# Patient Record
Sex: Male | Born: 1975
Health system: Southern US, Community
[De-identification: ages and names within clinical notes are randomized; demographics above are authoritative.]

## PROBLEM LIST (undated history)

## (undated) DIAGNOSIS — R635 Abnormal weight gain: Secondary | ICD-10-CM

## (undated) DIAGNOSIS — T50905A Adverse effect of unspecified drugs, medicaments and biological substances, initial encounter: Secondary | ICD-10-CM

## (undated) DIAGNOSIS — F419 Anxiety disorder, unspecified: Secondary | ICD-10-CM

## (undated) DIAGNOSIS — F410 Panic disorder [episodic paroxysmal anxiety] without agoraphobia: Secondary | ICD-10-CM

## (undated) HISTORY — PX: COSMETIC SURGERY: SHX468

## (undated) HISTORY — DX: Abnormal weight gain: R63.5

## (undated) HISTORY — PX: VASECTOMY: SHX75

## (undated) HISTORY — DX: Adverse effect of unspecified drugs, medicaments and biological substances, initial encounter: T50.905A

---

## 2016-03-26 DIAGNOSIS — Z0000 Encounter for general adult medical examination without abnormal findings: Secondary | ICD-10-CM | POA: Diagnosis not present

## 2016-07-08 DIAGNOSIS — Z23 Encounter for immunization: Secondary | ICD-10-CM | POA: Diagnosis not present

## 2017-02-24 DIAGNOSIS — L02415 Cutaneous abscess of right lower limb: Secondary | ICD-10-CM | POA: Diagnosis not present

## 2017-02-24 DIAGNOSIS — L03115 Cellulitis of right lower limb: Secondary | ICD-10-CM | POA: Diagnosis not present

## 2017-04-02 DIAGNOSIS — Z0000 Encounter for general adult medical examination without abnormal findings: Secondary | ICD-10-CM | POA: Diagnosis not present

## 2017-04-06 DIAGNOSIS — G5621 Lesion of ulnar nerve, right upper limb: Secondary | ICD-10-CM | POA: Diagnosis not present

## 2017-04-06 DIAGNOSIS — M654 Radial styloid tenosynovitis [de Quervain]: Secondary | ICD-10-CM | POA: Diagnosis not present

## 2017-04-06 DIAGNOSIS — F411 Generalized anxiety disorder: Secondary | ICD-10-CM | POA: Diagnosis not present

## 2017-05-25 DIAGNOSIS — F900 Attention-deficit hyperactivity disorder, predominantly inattentive type: Secondary | ICD-10-CM | POA: Diagnosis not present

## 2017-05-25 DIAGNOSIS — F411 Generalized anxiety disorder: Secondary | ICD-10-CM | POA: Diagnosis not present

## 2017-06-29 DIAGNOSIS — Z3009 Encounter for other general counseling and advice on contraception: Secondary | ICD-10-CM | POA: Diagnosis not present

## 2017-08-07 DIAGNOSIS — Z302 Encounter for sterilization: Secondary | ICD-10-CM | POA: Diagnosis not present

## 2017-08-16 DIAGNOSIS — Z23 Encounter for immunization: Secondary | ICD-10-CM | POA: Diagnosis not present

## 2017-11-09 DIAGNOSIS — Z9852 Vasectomy status: Secondary | ICD-10-CM | POA: Diagnosis not present

## 2017-12-15 DIAGNOSIS — F411 Generalized anxiety disorder: Secondary | ICD-10-CM | POA: Diagnosis not present

## 2017-12-15 DIAGNOSIS — F410 Panic disorder [episodic paroxysmal anxiety] without agoraphobia: Secondary | ICD-10-CM | POA: Diagnosis not present

## 2017-12-30 DIAGNOSIS — J301 Allergic rhinitis due to pollen: Secondary | ICD-10-CM | POA: Diagnosis not present

## 2018-01-04 DIAGNOSIS — F411 Generalized anxiety disorder: Secondary | ICD-10-CM | POA: Diagnosis not present

## 2018-01-04 DIAGNOSIS — F410 Panic disorder [episodic paroxysmal anxiety] without agoraphobia: Secondary | ICD-10-CM | POA: Diagnosis not present

## 2018-01-14 DIAGNOSIS — J301 Allergic rhinitis due to pollen: Secondary | ICD-10-CM | POA: Diagnosis not present

## 2018-02-15 DIAGNOSIS — F411 Generalized anxiety disorder: Secondary | ICD-10-CM | POA: Diagnosis not present

## 2018-02-15 DIAGNOSIS — F410 Panic disorder [episodic paroxysmal anxiety] without agoraphobia: Secondary | ICD-10-CM | POA: Diagnosis not present

## 2018-04-06 DIAGNOSIS — Z0000 Encounter for general adult medical examination without abnormal findings: Secondary | ICD-10-CM | POA: Diagnosis not present

## 2018-05-25 DIAGNOSIS — L237 Allergic contact dermatitis due to plants, except food: Secondary | ICD-10-CM | POA: Diagnosis not present

## 2018-05-31 DIAGNOSIS — L237 Allergic contact dermatitis due to plants, except food: Secondary | ICD-10-CM | POA: Diagnosis not present

## 2018-07-19 DIAGNOSIS — Z23 Encounter for immunization: Secondary | ICD-10-CM | POA: Diagnosis not present

## 2018-09-02 DIAGNOSIS — F411 Generalized anxiety disorder: Secondary | ICD-10-CM | POA: Diagnosis not present

## 2018-09-02 DIAGNOSIS — R635 Abnormal weight gain: Secondary | ICD-10-CM | POA: Diagnosis not present

## 2018-09-02 DIAGNOSIS — F410 Panic disorder [episodic paroxysmal anxiety] without agoraphobia: Secondary | ICD-10-CM | POA: Diagnosis not present

## 2018-09-06 ENCOUNTER — Ambulatory Visit (INDEPENDENT_AMBULATORY_CARE_PROVIDER_SITE_OTHER): Payer: BLUE CROSS/BLUE SHIELD | Admitting: Physician Assistant

## 2018-09-06 ENCOUNTER — Encounter: Payer: Self-pay | Admitting: Physician Assistant

## 2018-09-06 VITALS — BP 127/80 | HR 79 | Ht 74.0 in | Wt 210.0 lb

## 2018-09-06 DIAGNOSIS — T50905A Adverse effect of unspecified drugs, medicaments and biological substances, initial encounter: Secondary | ICD-10-CM

## 2018-09-06 DIAGNOSIS — R635 Abnormal weight gain: Secondary | ICD-10-CM

## 2018-09-06 DIAGNOSIS — F429 Obsessive-compulsive disorder, unspecified: Secondary | ICD-10-CM | POA: Diagnosis not present

## 2018-09-06 DIAGNOSIS — F4010 Social phobia, unspecified: Secondary | ICD-10-CM | POA: Diagnosis not present

## 2018-09-06 HISTORY — DX: Adverse effect of unspecified drugs, medicaments and biological substances, initial encounter: R63.5

## 2018-09-06 HISTORY — DX: Adverse effect of unspecified drugs, medicaments and biological substances, initial encounter: T50.905A

## 2018-09-06 MED ORDER — FLUOXETINE HCL 40 MG PO CAPS: 40.0000 mg | ORAL_CAPSULE | Freq: Every day | ORAL | 1 refills | Status: DC

## 2018-09-06 NOTE — Progress Notes (Signed)
[facility] MD/PA/NP Initial Note  09/06/2018 3:03 PM Robert Robbins  MRN:  [id number]  Chief Complaint:  Chief Complaint    Anxiety      HPI: Here for initial visit for anxiety, panic attacks, social anxiety.  Robert Robbins is a 42 year old male who presents with social anxiety.  He states he has always had problems, especially with public speaking.  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  In the past year or so, he became more anxious and having panic attacks sometimes several days a week.  He would have palpitations and gets short of breath.  Most of the time it was a situational anxiety, in public and when he was having to speak.  He saw his PCP and was put on Lexapro up to 20 mg at the highest dose.  He states he was doing very well on that and had less anxiety and panic attacks.  However he gained 35 pounds over the course of 8 or 9 months.  He saw his PCP again about 6 weeks ago and was put on Prozac 20 mg instead.  He states the anxiety is not as well controlled, having had a panic attack as recent as yesterday.  The Xanax that he is been given for breakthrough was not as helpful yesterday as it had been.  He usually only takes Xanax maybe 1 to 2 pills a week.  He is unsure if the Prozac has caused any weight gain but his clothes are still fitting and he does not think anything has changed.  He does not feel like he is lost any though.  Patient denies loss of interest in usual activities and is able to enjoy things.  Denies decreased energy or motivation.  Appetite has not changed.  No extreme sadness, tearfulness, or feelings of hopelessness.  Denies any changes in concentration, making decisions or remembering things.  Denies suicidal or homicidal thoughts.  Patient denies increased energy with decreased need for sleep, no increased talkativeness, no racing thoughts, no impulsivity or risky behaviors, no increased spending, no increased libido, no  grandiosity.  He sometimes gets irritable in the evenings after he gets home.  He feels that the anxiety comes out as grumpiness at times.  He has had GeneSight testing done.  See report in scanned records.   Visit Diagnosis:    ICD-10-CM   1. Social anxiety disorder F40.10   2. Obsessive-compulsive disorder, unspecified type F42.9     Past Psychiatric History: None reported except possibly OCD when younger.  Looking back, he states he probably had anxiety as well but was able to overcome it and it did not affect his school or work.  Past Medical History:  Past Medical History:  Diagnosis Date  . Weight gain due to medication 09/06/2018   with Lexapro 35# over 8 months    Past Surgical History:  Procedure Laterality Date  . COSMETIC SURGERY Bilateral    auricular bilat  . VASECTOMY      Family Psychiatric History:   Family History:  Family History  Problem Relation Age of Onset  . Anxiety disorder Mother   . Hypertension Mother   . Obesity Mother   . Anxiety disorder Father   . Atrial fibrillation Father   . Hyperlipidemia Father   . Obesity Father   . Bipolar disorder Maternal Aunt   . Bipolar disorder Cousin   . Aneurysm Maternal Grandfather   .  CVA Maternal Grandfather   . Coronary artery disease Paternal Grandfather   . Anxiety disorder Daughter   . Anxiety disorder Son     Social History:  Social History   Socioeconomic History  . Marital status: Married    Spouse name: Not on file  . Number of children: 2  . Years of education: Not on file  . Highest education level: Bachelor's degree (e.g., BA, AB, BS)  Occupational History  . Occupation: Photographer   Social Needs  . Financial resource strain: Not hard at all  . Food insecurity:    Worry: Never true    Inability: Never true  . Transportation needs:    Medical: No    Non-medical: No  Tobacco Use  . Smoking status: Former Smoker    Last attempt to quit: 09/06/2005    Years since quitting: 13.0   . Smokeless tobacco: Former Neurosurgeon    Quit date: 09/06/2005  Substance and Sexual Activity  . Alcohol use: Yes    Alcohol/week: 12.0 standard drinks    Types: 12 Cans of beer per week    Comment: per week  . Drug use: Never    Comment: except pot in college  . Sexual activity: Yes  Lifestyle  . Physical activity:    Days per week: 3 days    Minutes per session: 150+ min  . Stress: To some extent  Relationships  . Social connections:    Talks on phone: More than three times a week    Gets together: More than three times a week    Attends religious service: More than 4 times per year    Active member of club or organization: Yes    Attends meetings of clubs or organizations: More than 4 times per year    Relationship status: Married  Other Topics Concern  . Not on file  Social History Narrative   Married with 2 kids, ages 16 and 66.  Second marriage.   Grew up in [place] parents were divorced, they had joint custody and they lived in same town. Has 2 step sisters and 1 step brother. Pt is youngest.      Robert Robbins goes to [employer].   He works in [employer]. [employer]      Caffeine per day 4 cups of coffee.  He cut  Back when he started having PA.       Allergies: No Known Allergies  Metabolic Disorder Labs: No results found for: HGBA1C, MPG No results found for: PROLACTIN No results found for: CHOL, TRIG, HDL, CHOLHDL, VLDL, LDLCALC No results found for: TSH  Therapeutic Level Labs: No results found for: LITHIUM No results found for: VALPROATE No components found for:  CBMZ  Current Medications: Current Outpatient Medications  Medication Sig Dispense Refill  . ALPRAZolam (XANAX) 0.5 MG tablet Take 0.5 mg by mouth 2 (two) times daily as needed for anxiety (usually only 1-2 per week).    . Ascorbic Acid (VITAMIN C) 100 MG tablet Take 100 mg by mouth daily.    Marland Kitchen FLUoxetine (PROZAC) 20 MG tablet Take 20 mg by mouth daily.    Marland Kitchen levocetirizine (XYZAL) 5  MG tablet Take 5 mg by mouth every evening.    Marland Kitchen FLUoxetine (PROZAC) 40 MG capsule Take 1 capsule (40 mg total) by mouth daily. 30 capsule 1   No current facility-administered medications for this visit.     Medication Side Effects: none  Orders placed this visit:  No orders of the defined types were placed in this encounter.   Psychiatric Specialty Exam:  ROS  Blood pressure 127/80, pulse 79, height 6\' 2"  (1.88 m), weight 210 lb (95.3 kg).Body mass index is 26.96 kg/m.  General Appearance: Casual and Well Groomed  Eye Contact:  Good  Speech:  Clear and Coherent  Volume:  Normal  Mood:  Euthymic  Affect:  Appropriate  Thought Process:  Goal Directed  Orientation:  Full (Time, Place, and Person)  Thought Content: Logical   Suicidal Thoughts:  No  Homicidal Thoughts:  No  Memory:  WNL  Judgement:  Good  Insight:  Good  Psychomotor Activity:  Normal  Concentration:  Concentration: Good  Recall:  Good  Fund of Knowledge: Good  Language: Good  Assets:  Desire for Improvement  ADL's:  Intact  Cognition: WNL  Prognosis:  Good   Screenings:  GAD-7     Office Visit from 09/06/2018 in [facility]  Total GAD-7 Score  3      Receiving Psychotherapy: Yes   Treatment Plan/Recommendations: We discussed several different options for anxiety.  An SSRI is a great choice, if the side effects are intolerable.  I recommend we increase Prozac to 40 mg and try that for 6 weeks.  Hopefully this will help with the anxiety, prevent panic attacks and decrease the irritability that he has at home associated with the anxiety.  He agrees with this plan.  He wanted to let me know right off the bat that due to expense, if this treatment works, he would like to have his PCP prescribe it and do follow-ups.  That is fine with me as long as he is stable. Another option would be to add BuSpar.  We did discuss the benefits and side effects.  We can start that over the phone if he feels  that the Prozac is not helping within about 4 to 5 weeks or is causing weight gain. Other ideas would be to add propranolol on a daily basis.  He does like to exercise and that can be a problem because you can get your heart rate up because of the beta-blocker.  He understands.  Another option could be gabapentin, SNRIs. At this point he will make an appointment for 6 weeks from now.  Again if he is doing well and wants to see his PCP, that is fine.  I have asked him to call our office however and cancel the appointment if he chooses to do that and I am happy to see him back at any point in the future if needed.    Melony Overly, PA-C

## 2018-10-19 ENCOUNTER — Ambulatory Visit (INDEPENDENT_AMBULATORY_CARE_PROVIDER_SITE_OTHER): Payer: BLUE CROSS/BLUE SHIELD | Admitting: Physician Assistant

## 2018-10-19 ENCOUNTER — Encounter: Payer: Self-pay | Admitting: Physician Assistant

## 2018-10-19 DIAGNOSIS — F429 Obsessive-compulsive disorder, unspecified: Secondary | ICD-10-CM

## 2018-10-19 DIAGNOSIS — F4010 Social phobia, unspecified: Secondary | ICD-10-CM | POA: Diagnosis not present

## 2018-10-19 MED ORDER — FLUOXETINE HCL 20 MG PO CAPS: 60.0000 mg | ORAL_CAPSULE | Freq: Every day | ORAL | 5 refills | Status: DC

## 2018-10-19 NOTE — Progress Notes (Signed)
[facility] Med Check  Patient ID: Robert Robbins,  MRN: [id number]  PCP: Gweneth Dimitri, MD  Date of Evaluation: 10/19/2018 Time spent:15 minutes  Chief Complaint:  Chief Complaint    Follow-up; Medication Refill       HISTORY/CURRENT STATUS: HPI For routine 6 week med check.   Doing well overall.  Since increasing the Prozac to 40mg , states he's gone from about a 4 to a 6-7, with 10 being the best he can feel.  And when he takes a Xanax, things are 'the best!' He only takes the Xanax when triggered, which is at work.  Has taken 1.5 pills in the past 5 days. He'll take a xanax before a meeting or something, which helps a lot. Not having panic attacks as often. Sleeps good.  Has lost 5 pounds.   Individual Medical History/ Review of Systems: Changes? :No   Allergies: Patient has no known allergies.  Current Medications:  Current Outpatient Medications:  .  ALPRAZolam (XANAX) 0.5 MG tablet, Take 0.5 mg by mouth 2 (two) times daily as needed for anxiety (usually only 1-2 per week)., Disp: , Rfl:  .  Ascorbic Acid (VITAMIN C) 100 MG tablet, Take 100 mg by mouth daily., Disp: , Rfl:  .  levocetirizine (XYZAL) 5 MG tablet, Take 5 mg by mouth every evening., Disp: , Rfl:  .  FLUoxetine (PROZAC) 20 MG capsule, Take 3 capsules (60 mg total) by mouth daily., Disp: 90 capsule, Rfl: 5 Medication Side Effects: none  Family Medical/ Social History: Changes? No  MENTAL HEALTH EXAM:  There were no vitals taken for this visit.There is no height or weight on file to calculate BMI.  General Appearance: Casual and Well Groomed  Eye Contact:  Good  Speech:  Clear and Coherent  Volume:  Normal  Mood:  Euthymic  Affect:  Appropriate  Thought Process:  Goal Directed  Orientation:  Full (Time, Place, and Person)  Thought Content: Logical   Suicidal Thoughts:  No  Homicidal Thoughts:  No  Memory:  WNL  Judgement:  Good  Insight:  Good  Psychomotor Activity:  Normal  Concentration:   Concentration: Good  Recall:  Good  Fund of Knowledge: Good  Language: Good  Assets:  Desire for Improvement  ADL's:  Intact  Cognition: WNL  Prognosis:  Good    DIAGNOSES:    ICD-10-CM   1. Social anxiety disorder F40.10   2. Obsessive-compulsive disorder, unspecified type F42.9     Receiving Psychotherapy: Yes marital and individual counseling, [facility].    RECOMMENDATIONS: Increase Prozac to 60 mg p.o. daily.  He is in agreement with this.  Other options would be to add BuSpar but I think we should maximize the Prozac first. Continue Xanax 0.5 mg half to 1 twice daily as needed.  Okay to refill when needed.  PCP has prescribed up until now. Continue psychotherapy. Return in 6 months or sooner as needed.  He prefers an appointment as far out as possible.  He states he will contact me if any problems arise in the meantime.    Melony Overly, PA-C

## 2019-04-19 ENCOUNTER — Ambulatory Visit: Payer: BLUE CROSS/BLUE SHIELD | Admitting: Physician Assistant

## 2019-04-25 ENCOUNTER — Other Ambulatory Visit: Payer: Self-pay | Admitting: Physician Assistant

## 2019-04-27 DIAGNOSIS — L237 Allergic contact dermatitis due to plants, except food: Secondary | ICD-10-CM | POA: Diagnosis not present

## 2019-06-20 ENCOUNTER — Other Ambulatory Visit: Payer: Self-pay | Admitting: Physician Assistant

## 2019-06-20 NOTE — Telephone Encounter (Signed)
Still hasn't set up an appt

## 2019-06-21 NOTE — Telephone Encounter (Signed)
Robert Robbins, please set up an appt and let me know when he does.  Then I'll send in the Rx. Thanks.

## 2019-06-23 NOTE — Telephone Encounter (Signed)
Needs appt.  Pt hasn't called back to set up.

## 2019-07-01 ENCOUNTER — Other Ambulatory Visit: Payer: Self-pay | Admitting: Physician Assistant

## 2019-07-04 ENCOUNTER — Telehealth: Payer: Self-pay | Admitting: Physician Assistant

## 2019-07-04 ENCOUNTER — Other Ambulatory Visit: Payer: Self-pay

## 2019-07-04 MED ORDER — FLUOXETINE HCL 20 MG PO CAPS: ORAL_CAPSULE | ORAL | 1 refills | Status: DC

## 2019-07-04 NOTE — Telephone Encounter (Signed)
Patient called and needs a refill on his prozac 20 mg. He mad an appt for 11/10. His pharmacy is [facility] [address]

## 2019-07-04 NOTE — Telephone Encounter (Signed)
Refill submitted per request.

## 2019-08-01 ENCOUNTER — Ambulatory Visit (INDEPENDENT_AMBULATORY_CARE_PROVIDER_SITE_OTHER): Payer: BC Managed Care – PPO | Admitting: Physician Assistant

## 2019-08-01 ENCOUNTER — Other Ambulatory Visit: Payer: Self-pay

## 2019-08-01 ENCOUNTER — Encounter: Payer: Self-pay | Admitting: Physician Assistant

## 2019-08-01 DIAGNOSIS — F429 Obsessive-compulsive disorder, unspecified: Secondary | ICD-10-CM

## 2019-08-01 DIAGNOSIS — F4010 Social phobia, unspecified: Secondary | ICD-10-CM

## 2019-08-01 MED ORDER — ALPRAZOLAM 0.5 MG PO TABS: 0.5000 mg | ORAL_TABLET | Freq: Two times a day (BID) | ORAL | 1 refills | Status: DC | PRN

## 2019-08-01 MED ORDER — FLUOXETINE HCL 20 MG PO CAPS: 60.0000 mg | ORAL_CAPSULE | Freq: Every day | ORAL | 1 refills | Status: DC

## 2019-08-01 NOTE — Progress Notes (Signed)
[facility] Med Check  Patient ID: Robert Robbins,  MRN: [id number]  PCP: Cari Caraway, MD  Date of Evaluation: 08/01/2019 Time spent:15 minutes  Chief Complaint:  Chief Complaint    Anxiety; Follow-up     Virtual Visit via Telephone Note  I connected with patient by a video enabled telemedicine application or telephone, with their informed consent, and verified patient privacy and that I am speaking with the correct person using two identifiers.  I am private, in my office and the patient is at home.  I discussed the limitations, risks, security and privacy concerns of performing an evaluation and management service by telephone and the availability of in person appointments. I also discussed with the patient that there may be a patient responsible charge related to this service. The patient expressed understanding and agreed to proceed.   I discussed the assessment and treatment plan with the patient. The patient was provided an opportunity to ask questions and all were answered. The patient agreed with the plan and demonstrated an understanding of the instructions.   The patient was advised to call back or seek an in-person evaluation if the symptoms worsen or if the condition fails to improve as anticipated.  I provided 15 minutes of non-face-to-face time during this encounter.  HISTORY/CURRENT STATUS: HPI For routine med check.  Hasn't been seen in 10 months partly d/t this provider's absence.    In Jan 2020, we increased the Prozac to 60 mg.  Within less than a month, he started feeling great.  Felt more energetic and motivated, but slept well. No impulsive or risky behaviors. Able to enjoy things.  No increased libido, or spending.  No grandiosity.  That intensity of feeling well lasted 2 or 3 months and then decreased just a little bit.  He still feels great though, and denies any symptoms of depression at present.  No symptoms or signs of mania.  No suicidal or homicidal  thoughts.  Anxiety has been very well controlled.  He started running almost daily a few months back.  He has always ran for exercise but had been more sporadic at it.  Feels that the anxiety is better since he has been exercising more frequently.  He rarely needs the Xanax now but it is helpful when he does have it.  Obsessive thoughts and actions are controlled.  He sleeps well.  Work is going fine.  Denies dizziness, syncope, seizures, numbness, tingling, tremor, tics, unsteady gait, slurred speech, confusion. Denies muscle or joint pain, stiffness, or dystonia.  Individual Medical History/ Review of Systems: Changes? :No    Past medications for mental health diagnoses include: Propranolol, Lexapro, Prozac, Xanax  Allergies: Patient has no known allergies.  Current Medications:  Current Outpatient Medications:  .  ALPRAZolam (XANAX) 0.5 MG tablet, Take 1 tablet (0.5 mg total) by mouth 2 (two) times daily as needed for anxiety (usually only 1-2 per week)., Disp: 30 tablet, Rfl: 1 .  Ascorbic Acid (VITAMIN C) 100 MG tablet, Take 100 mg by mouth daily., Disp: , Rfl:  .  FLUoxetine (PROZAC) 20 MG capsule, Take 3 capsules (60 mg total) by mouth daily., Disp: 270 capsule, Rfl: 1 .  levocetirizine (XYZAL) 5 MG tablet, Take 5 mg by mouth every evening., Disp: , Rfl:  Medication Side Effects: none  Family Medical/ Social History: Changes? Yes  Working from home since Feb, b/c pandemic.  MENTAL HEALTH EXAM:  There were no vitals taken for this visit.There is no height or weight on  file to calculate BMI.  General Appearance: unable to assess  Eye Contact:  unable to assess  Speech:  Clear and Coherent  Volume:  Normal  Mood:  Euthymic  Affect:  unable to assess  Thought Process:  Goal Directed  Orientation:  Full (Time, Place, and Person)  Thought Content: Logical   Suicidal Thoughts:  No  Homicidal Thoughts:  No  Memory:  WNL  Judgement:  Good  Insight:  Good  Psychomotor Activity:   unable to assess  Concentration:  Concentration: Good  Recall:  Good  Fund of Knowledge: Good  Language: Good  Assets:  Desire for Improvement  ADL's:  Intact  Cognition: WNL  Prognosis:  Good    DIAGNOSES:    ICD-10-CM   1. Social anxiety disorder  F40.10   2. Obsessive-compulsive disorder, unspecified type  F42.9     Receiving Psychotherapy: Yes  discipleship perspective, not seeing Molli Hazard, for now, but will go back if he needs to.   RECOMMENDATIONS:  We discussed the possibility of bipolar disorder given his signs after increasing the Prozac at the last visit.  My gut feeling is that the symptoms were just improving and he was not having a manic or hypomanic episode.  We will need to watch for that though.  He verbalizes understanding and knows what to watch for. Continue Prozac 20 mg, 3 p.o. daily. Continue Xanax 0.5 mg 1 twice daily as needed. Continue exercising daily if possible. Return in 6 months.  If he is still doing really well at that point, we could go out a year for a medication check.  Melony Overly, PA-C

## 2019-08-09 DIAGNOSIS — Z23 Encounter for immunization: Secondary | ICD-10-CM | POA: Diagnosis not present

## 2020-02-01 ENCOUNTER — Other Ambulatory Visit: Payer: Self-pay | Admitting: Physician Assistant

## 2020-06-21 ENCOUNTER — Other Ambulatory Visit: Payer: Self-pay | Admitting: Physician Assistant

## 2020-06-21 ENCOUNTER — Telehealth: Payer: Self-pay | Admitting: Physician Assistant

## 2020-06-21 MED ORDER — FLUOXETINE HCL 20 MG PO CAPS: ORAL_CAPSULE | ORAL | 0 refills | Status: DC

## 2020-06-21 NOTE — Telephone Encounter (Signed)
Last apt 07/2019 Scheduled on 07/24/2020, okay to send Rx?

## 2020-06-21 NOTE — Telephone Encounter (Signed)
Pt made appt for 11/3. Pt would like a refill on fluoxetine 20mg  cap. Please send to [facility] [address] 

## 2020-06-21 NOTE — Telephone Encounter (Signed)
Prescription was sent in.

## 2020-06-27 DIAGNOSIS — F411 Generalized anxiety disorder: Secondary | ICD-10-CM | POA: Diagnosis not present

## 2020-06-27 DIAGNOSIS — J309 Allergic rhinitis, unspecified: Secondary | ICD-10-CM | POA: Diagnosis not present

## 2020-06-27 DIAGNOSIS — F410 Panic disorder [episodic paroxysmal anxiety] without agoraphobia: Secondary | ICD-10-CM | POA: Diagnosis not present

## 2020-07-24 ENCOUNTER — Other Ambulatory Visit: Payer: Self-pay

## 2020-07-24 ENCOUNTER — Encounter: Payer: Self-pay | Admitting: Physician Assistant

## 2020-07-24 ENCOUNTER — Ambulatory Visit (INDEPENDENT_AMBULATORY_CARE_PROVIDER_SITE_OTHER): Payer: BC Managed Care – PPO | Admitting: Physician Assistant

## 2020-07-24 DIAGNOSIS — F429 Obsessive-compulsive disorder, unspecified: Secondary | ICD-10-CM

## 2020-07-24 DIAGNOSIS — F4010 Social phobia, unspecified: Secondary | ICD-10-CM

## 2020-07-24 DIAGNOSIS — F1021 Alcohol dependence, in remission: Secondary | ICD-10-CM

## 2020-07-24 MED ORDER — FLUOXETINE HCL 40 MG PO CAPS: 40.0000 mg | ORAL_CAPSULE | Freq: Every day | ORAL | 1 refills | Status: DC

## 2020-07-24 NOTE — Progress Notes (Signed)
[facility]  Patient ID: Robert Robbins,  MRN: [id number]  PCP: Gweneth Dimitri, MD  Date of Evaluation: 07/24/2020 Time spent:30 minutes  Chief Complaint:  Chief Complaint    Anxiety       HISTORY/CURRENT STATUS: HPI For routine med check.  Wants to discuss alcohol increase. Over the past year, he's increased his ETOH intake. Up until 3 weeks ago, he'd gotten up to 3-4 beers qd. Occas 5 but not often. He'd had a 2 beer/d limit. He was drinking to help decompress after work.  He felt like it had become a habit too. 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 Since he stopped the ETOH, hasn't had cravings but a couple of times.  He stopped drinking completely 3 weeks ago, cold [place].  He has had no seizures, tremors or any other withdrawals.  He is wondering if he may need to change the dose of Prozac.  Not sure if he has been turning to alcohol to treat something that he is not aware of.  He is able to to enjoy things.  Energy and motivation depend on the day and what is going on in his professional and personal life.  He is not isolating.  He does get anxious at times, maybe more so than he had been this time last year for example.  Not really having panic attacks.  OCD symptoms are controlled for the most part but sometimes he will get something on his mind that he is unable to make it go away.  He sleeps very well.  No suicidal or homicidal thoughts.  Patient denies increased energy with decreased need for sleep, no increased talkativeness, no racing thoughts, no impulsivity or risky behaviors, no increased spending, no increased libido, no grandiosity, no increased irritability or anger, and no hallucinations.  Denies dizziness, syncope, seizures, numbness, tingling, tremor, tics, unsteady gait, slurred speech, confusion. Denies muscle or joint pain, stiffness, or dystonia.  Individual Medical History/  Review of Systems: Changes? :No    Past medications for mental health diagnoses include: Propranolol, Lexapro, Prozac, Xanax  Allergies: Patient has no known allergies.  Current Medications:  Current Outpatient Medications:    ALPRAZolam (XANAX) 0.5 MG tablet, Take 1 tablet (0.5 mg total) by mouth 2 (two) times daily as needed for anxiety (usually only 1-2 per week)., Disp: 30 tablet, Rfl: 1   levocetirizine (XYZAL) 5 MG tablet, Take 5 mg by mouth every evening., Disp: , Rfl:    Ascorbic Acid (VITAMIN C) 100 MG tablet, Take 100 mg by mouth daily. (Patient not taking: Reported on 07/24/2020), Disp: , Rfl:    FLUoxetine (PROZAC) 40 MG capsule, Take 1 capsule (40 mg total) by mouth daily., Disp: 60 capsule, Rfl: 1 Medication Side Effects: none  Family Medical/ Social History: Changes? See HPI  MENTAL HEALTH EXAM:  There were no vitals taken for this visit.There is no height or weight on file to calculate BMI.  General Appearance: Casual, Neat and Well Groomed  Eye Contact:  Good  Speech:  Clear and Coherent  Volume:  Normal  Mood:  Euthymic  Affect:  Appropriate  Thought Process:  Goal Directed and Descriptions of Associations: Intact  Orientation:  Full (Time, Place, and Person)  Thought Content: Logical   Suicidal Thoughts:  No  Homicidal Thoughts:  No  Memory:  WNL  Judgement:  Good  Insight:  Good  Psychomotor Activity:  Normal  Concentration:  Concentration: Good  Recall:  Good  Fund of Knowledge: Good  Language: Good  Assets:  Desire for Improvement  ADL's:  Intact  Cognition: WNL  Prognosis:  Good    DIAGNOSES:    ICD-10-CM   1. Social anxiety disorder  F40.10   2. Obsessive-compulsive disorder, unspecified type  F42.9   3. Alcohol use disorder, moderate, in early remission (HCC)  F10.21     Receiving Psychotherapy: Yes  Involved in a men's group through church.   RECOMMENDATIONS:  PDMP was reviewed. I provided 30 minutes of face-to-face time during  this encounter. We discussed the increased alcohol.  I am glad he was able to stop on his own without problems. I agree that he may be trying to treat a mental health issue with the alcohol, and increasing the Prozac is a good idea.  He would like to proceed. We discussed using either a acamprosate or naltrexone.  Since he is not having hardly any cravings at all, we agreed not to start the acamprosate right now.  If however he does start having extreme cravings, he will call and I can add acamprosate over the phone.   Discussed that he should be careful with Xanax, which his PCP prescribes.  If he takes it often, he can end up with the tolerance problem and needing to increase the dose which can lead to addiction.  We do not want him to go from one thing to another. Increase Prozac to 40 mg, 2 p.o. every morning. Continue Xanax 0.5 mg 1 twice daily as needed.  PCP prescribes. Recommend B complex with extra thiamine. Return in 4 to 6 weeks.  Melony Overly, PA-C

## 2020-08-09 ENCOUNTER — Other Ambulatory Visit: Payer: Self-pay | Admitting: Physician Assistant

## 2020-08-27 ENCOUNTER — Other Ambulatory Visit: Payer: Self-pay

## 2020-08-27 ENCOUNTER — Telehealth: Payer: Self-pay | Admitting: Physician Assistant

## 2020-08-27 MED ORDER — FLUOXETINE HCL 40 MG PO CAPS: 80.0000 mg | ORAL_CAPSULE | Freq: Every day | ORAL | 1 refills | Status: DC

## 2020-08-27 NOTE — Telephone Encounter (Signed)
Per last office visit patient should be taking Prozac 40 mg 2 daily. Rx updated correctly.

## 2020-08-27 NOTE — Telephone Encounter (Signed)
Robert Robbins called about the refill of his Fluoxetine. When he picked it up 07/24/20 it was the right # of tablets but the directions said to take 1 q day but it should be 2 q day. So the pharmacy will fill his refill because according to the directions it is too soon to fill.  Please send in a new prescription with the correct directions and be sure to tell the pharmacy it is ok to fill now.  This change in dose is going very well.  He has appt 09/04/20.  [facility] [address], [place]

## 2020-09-04 ENCOUNTER — Other Ambulatory Visit: Payer: Self-pay

## 2020-09-04 ENCOUNTER — Encounter: Payer: Self-pay | Admitting: Physician Assistant

## 2020-09-04 ENCOUNTER — Ambulatory Visit (INDEPENDENT_AMBULATORY_CARE_PROVIDER_SITE_OTHER): Payer: BC Managed Care – PPO | Admitting: Physician Assistant

## 2020-09-04 DIAGNOSIS — F429 Obsessive-compulsive disorder, unspecified: Secondary | ICD-10-CM | POA: Diagnosis not present

## 2020-09-04 DIAGNOSIS — F4010 Social phobia, unspecified: Secondary | ICD-10-CM

## 2020-09-04 MED ORDER — FLUOXETINE HCL 40 MG PO CAPS: 80.0000 mg | ORAL_CAPSULE | Freq: Every day | ORAL | 1 refills | Status: DC

## 2020-09-04 NOTE — Progress Notes (Signed)
[facility]  Patient ID: An Schnabel,  MRN: [id number]  PCP: Gweneth Dimitri, MD  Date of Evaluation: 09/04/2020 Time spent:20 minutes  Chief Complaint:  Chief Complaint    Anxiety       HISTORY/CURRENT STATUS: HPI For routine med check.  Robert Robbins states he is doing great.  "Perfect!"  At the last visit 6 weeks ago we increased the Prozac to 80 mg and it has made a very positive difference.  The anxiety is much better.  He took the Xanax 1 time before big presentation at work but that is the only time he is needed it.  When he has a presentation that is not quite as important let say, he does not have anxiety like he used to.  He has not had any alcohol since early October.  He is very happy about that positive change.  He is able to enjoy things.  Energy and motivation are good.  He is not isolating.  Not crying easily.  He is eating well with no change in weight.  Denies suicidal or homicidal thoughts.  Patient denies increased energy with decreased need for sleep, no increased talkativeness, no racing thoughts, no impulsivity or risky behaviors, no increased spending, no increased libido, no grandiosity, no increased irritability or anger, and no hallucinations.  Denies dizziness, syncope, seizures, numbness, tingling, tremor, tics, unsteady gait, slurred speech, confusion. Denies muscle or joint pain, stiffness, or dystonia.  Individual Medical History/ Review of Systems: Changes? :No    Past medications for mental health diagnoses include: Propranolol, Lexapro, Prozac, Xanax  Allergies: Patient has no known allergies.  Current Medications:  Current Outpatient Medications:  .  ALPRAZolam (XANAX) 0.5 MG tablet, Take 1 tablet (0.5 mg total) by mouth 2 (two) times daily as needed for anxiety (usually only 1-2 per week)., Disp: 30 tablet, Rfl: 1 .  levocetirizine (XYZAL) 5 MG tablet, Take 5 mg by mouth every evening., Disp: , Rfl:  .  Ascorbic Acid (VITAMIN C) 100 MG  tablet, Take 100 mg by mouth daily. (Patient not taking: No sig reported), Disp: , Rfl:  .  FLUoxetine (PROZAC) 40 MG capsule, Take 2 capsules (80 mg total) by mouth daily., Disp: 180 capsule, Rfl: 1 Medication Side Effects: none  Family Medical/ Social History: Changes? See HPI  MENTAL HEALTH EXAM:  There were no vitals taken for this visit.There is no height or weight on file to calculate BMI.  General Appearance: Casual, Neat and Well Groomed  Eye Contact:  Good  Speech:  Clear and Coherent and Normal Rate  Volume:  Normal  Mood:  Euthymic  Affect:  Appropriate  Thought Process:  Goal Directed and Descriptions of Associations: Intact  Orientation:  Full (Time, Place, and Person)  Thought Content: Logical   Suicidal Thoughts:  No  Homicidal Thoughts:  No  Memory:  WNL  Judgement:  Good  Insight:  Good  Psychomotor Activity:  Normal  Concentration:  Concentration: Good  Recall:  Good  Fund of Knowledge: Good  Language: Good  Assets:  Desire for Improvement  ADL's:  Intact  Cognition: WNL  Prognosis:  Good    DIAGNOSES:    ICD-10-CM   1. Obsessive-compulsive disorder, unspecified type  F42.9   2. Social anxiety disorder  F40.10     Receiving Psychotherapy: Yes  Involved in a men's group through church.   RECOMMENDATIONS:  PDMP was reviewed. I provided 20 minutes of face-to-face time during this encounter. I am glad to see him doing  so well! Continue Prozac 40 mg, 2 p.o. every morning. Continue Xanax 0.5 mg 1 twice daily as needed.  PCP prescribes. Return in 6 months.   Melony Overly, PA-C

## 2020-10-05 DIAGNOSIS — Z1152 Encounter for screening for COVID-19: Secondary | ICD-10-CM | POA: Diagnosis not present

## 2021-03-05 ENCOUNTER — Ambulatory Visit: Payer: BC Managed Care – PPO | Admitting: Physician Assistant

## 2021-05-03 DIAGNOSIS — L255 Unspecified contact dermatitis due to plants, except food: Secondary | ICD-10-CM | POA: Diagnosis not present

## 2021-05-15 ENCOUNTER — Emergency Department (HOSPITAL_BASED_OUTPATIENT_CLINIC_OR_DEPARTMENT_OTHER): Payer: BC Managed Care – PPO

## 2021-05-15 ENCOUNTER — Emergency Department (HOSPITAL_BASED_OUTPATIENT_CLINIC_OR_DEPARTMENT_OTHER)
Admission: EM | Admit: 2021-05-15 | Discharge: 2021-05-15 | Disposition: A | Discharge status: Home / Self Care | Payer: BC Managed Care – PPO | Attending: Emergency Medicine | Admitting: Emergency Medicine

## 2021-05-15 ENCOUNTER — Other Ambulatory Visit: Payer: Self-pay

## 2021-05-15 ENCOUNTER — Encounter (HOSPITAL_BASED_OUTPATIENT_CLINIC_OR_DEPARTMENT_OTHER): Payer: Self-pay

## 2021-05-15 DIAGNOSIS — R079 Chest pain, unspecified: Secondary | ICD-10-CM | POA: Diagnosis not present

## 2021-05-15 DIAGNOSIS — F419 Anxiety disorder, unspecified: Secondary | ICD-10-CM | POA: Diagnosis not present

## 2021-05-15 DIAGNOSIS — Z87891 Personal history of nicotine dependence: Secondary | ICD-10-CM | POA: Diagnosis not present

## 2021-05-15 DIAGNOSIS — R0789 Other chest pain: Secondary | ICD-10-CM | POA: Diagnosis not present

## 2021-05-15 HISTORY — DX: Panic disorder (episodic paroxysmal anxiety): F41.0

## 2021-05-15 HISTORY — DX: Anxiety disorder, unspecified: F41.9

## 2021-05-15 LAB — CBC
HCT: 41.3 % (ref 39.0–52.0)
Hemoglobin: 13.8 g/dL (ref 13.0–17.0)
MCH: 30.3 pg (ref 26.0–34.0)
MCHC: 33.4 g/dL (ref 30.0–36.0)
MCV: 90.8 fL (ref 80.0–100.0)
Platelets: 234 10*3/uL (ref 150–400)
RBC: 4.55 MIL/uL (ref 4.22–5.81)
RDW: 13 % (ref 11.5–15.5)
WBC: 6.6 10*3/uL (ref 4.0–10.5)
nRBC: 0 % (ref 0.0–0.2)

## 2021-05-15 LAB — TROPONIN I (HIGH SENSITIVITY): Troponin I (High Sensitivity): <2 ng/L (ref <18)

## 2021-05-15 LAB — BASIC METABOLIC PANEL
Anion gap: 8 (ref 5–15)
BUN: 15 mg/dL (ref 6–20)
CO2: 29 mmol/L (ref 22–32)
Calcium: 8.8 mg/dL — ABNORMAL LOW (ref 8.9–10.3)
Chloride: 101 mmol/L (ref 98–111)
Creatinine, Ser: 1.14 mg/dL (ref 0.61–1.24)
GFR, Estimated: >60 mL/min (ref >60)
Glucose, Bld: 89 mg/dL (ref 70–99)
Potassium: 4.4 mmol/L (ref 3.5–5.1)
Sodium: 138 mmol/L (ref 135–145)

## 2021-05-15 NOTE — ED Provider Notes (Signed)
[facility] EMERGENCY DEPARTMENT Provider Note   CSN: [id number] Arrival date & time: 05/15/21  1335     History Chief Complaint  Patient presents with   Chest Pain    Robert Robbins is a 45 y.o. male with PMHx anxiety and panic attacks who presents to the ED Today with complaint of gradual onset, intermittent, sharp/stabbing, diffuse chest pain that has been present for the past 2 weeks, gradually worsening. Pt does endorse hx of of anxiety and panic attacks. He reports that at times he will have physical manifestations of his anxiety including chest pain however he felt like it was under control with 80 mg Prozac daily. Pt does admit that he has been under an increased amount of stress lately with work and is unsure if this could be causing his symptoms. He states he had worsening pain today and a "weird" sensation in his BUEs causing concern. Pt decided to come to the ED for further evaluation. He denies any diaphoresis, nausea, vomiting, shortness of breath with the chest pain. He states that earlier in the week he took a Xanax which seemed to completely resolve his chest pain. He did not try to take a Xanax today prior to coming to the ED. Pt states he does not have any chest pain currently. PT is typically an active person and does not have chest pains with exercise. He is a former smoker, quit > 10 years ago. He reports his mother had a "stress heart attack" in her late 27s. He denies any hx DVT/PE. No recent prolonged travel or immobilization. No  hemoptysis. No active malignancy. No exogenous hormone use.   The history is provided by the patient and medical records.      Past Medical History:  Diagnosis Date   Anxiety    Panic attack    Weight gain due to medication 09/06/2018   with Lexapro 35# over 8 months    Patient Active Problem List   Diagnosis Date Noted   Social anxiety disorder 09/06/2018    Past Surgical History:  Procedure Laterality Date   COSMETIC  SURGERY Bilateral    auricular bilat   VASECTOMY         Family History  Problem Relation Age of Onset   Anxiety disorder Mother    Hypertension Mother    Obesity Mother    Anxiety disorder Father    Atrial fibrillation Father    Hyperlipidemia Father    Obesity Father    Bipolar disorder Maternal Aunt    Bipolar disorder Cousin    Aneurysm Maternal Grandfather    CVA Maternal Grandfather    Coronary artery disease Paternal Grandfather    Anxiety disorder Daughter    Anxiety disorder Son     Social History   Tobacco Use   Smoking status: Former    Types: Cigarettes    Quit date: 09/06/2005    Years since quitting: 15.6   Smokeless tobacco: Former    Quit date: 09/06/2005  Vaping Use   Vaping Use: Never used  Substance Use Topics   Alcohol use: Not Currently    Alcohol/week: 1.0 standard drink    Types: 1 Cans of beer per week   Drug use: Never    Home Medications Prior to Admission medications   Medication Sig Start Date End Date Taking? Authorizing Provider  ALPRAZolam Prudy Feeler) 0.5 MG tablet Take 1 tablet (0.5 mg total) by mouth 2 (two) times daily as needed for anxiety (usually only 1-2  per week). 08/01/19   Cherie Ouch, PA-C  Ascorbic Acid (VITAMIN C) 100 MG tablet Take 100 mg by mouth daily. Patient not taking: No sig reported    [provider]  FLUoxetine (PROZAC) 40 MG capsule Take 2 capsules (80 mg total) by mouth daily. 09/04/20   Melony Overly T, PA-C  levocetirizine (XYZAL) 5 MG tablet Take 5 mg by mouth every evening.    [provider]    Allergies    Patient has no known allergies.  Review of Systems   Review of Systems  Constitutional:  Negative for chills, diaphoresis and fever.  Respiratory:  Positive for chest tightness. Negative for shortness of breath.   Cardiovascular:  Positive for chest pain. Negative for palpitations and leg swelling.  Gastrointestinal:  Negative for nausea and vomiting.  All other systems  reviewed and are negative.  Physical Exam Updated Vital Signs BP 112/71   Pulse 65   Temp 98.2 F (36.8 C) (Oral)   Resp 18   Ht 6\' 2"  (1.88 m)   Wt 96.6 kg   SpO2 98%   BMI 27.35 kg/m   Physical Exam Vitals and nursing note reviewed.  Constitutional:      Appearance: He is not ill-appearing or diaphoretic.  HENT:     Head: Normocephalic and atraumatic.  Eyes:     Conjunctiva/sclera: Conjunctivae normal.  Cardiovascular:     Rate and Rhythm: Normal rate and regular rhythm.     Pulses:          Radial pulses are 2+ on the right side and 2+ on the left side.     Heart sounds: Normal heart sounds.  Pulmonary:     Effort: Pulmonary effort is normal.     Breath sounds: Normal breath sounds. No decreased breath sounds, wheezing, rhonchi or rales.  Abdominal:     Palpations: Abdomen is soft.     Tenderness: There is no abdominal tenderness.  Musculoskeletal:     Cervical back: Neck supple.     Right lower leg: No edema.     Left lower leg: No edema.  Skin:    General: Skin is warm and dry.  Neurological:     Mental Status: He is alert.    ED Results / Procedures / Treatments   Labs (all labs ordered are listed, but only abnormal results are displayed) Labs Reviewed  BASIC METABOLIC PANEL - Abnormal; Notable for the following components:      Result Value   Calcium 8.8 (*)    All other components within normal limits  CBC  TROPONIN I (HIGH SENSITIVITY)    EKG EKG Interpretation  Date/Time:  Thursday May 15 2021 13:43:35 EDT Ventricular Rate:  83 PR Interval:  134 QRS Duration: 84 QT Interval:  378 QTC Calculation: 444 R Axis:   22 Text Interpretation: Normal sinus rhythm Normal ECG Confirmed by 08[birth year]1991 [phone]) on 05/15/2021 2:11:18 PM Also confirmed by 05/17/2021 [phone]), editor ([phone])  on 05/15/2021 2:50:45 PM  Radiology DG Chest 2 View  Result Date: 05/15/2021 CLINICAL DATA:  Chest pain EXAM: CHEST - 2 VIEW  COMPARISON:  None. FINDINGS: The heart size and mediastinal contours are within normal limits. Both lungs are clear. The visualized skeletal structures are unremarkable. IMPRESSION: No active cardiopulmonary disease. Electronically Signed   By: 05/17/2021 D.O.   On: 05/15/2021 14:34    Procedures Procedures   Medications Ordered in ED Medications - No data to display  ED Course  I have reviewed the triage vital signs and the nursing notes.  Pertinent labs & imaging results that were available during my care of the patient were reviewed by me and considered in my medical decision making (see chart for details).    MDM Rules/Calculators/A&P                           46 year old male presenting to the ED Today with complaint of intermittent chest tightness/stabbing for the past 2 weeks with hx of anxiety on Prozac and PRN Xanax. On arrival to the ED VSS. BP slightly elevated initially 143/111 with repeat 112/71. Pt not currently having any active chest pain. EKG without acute ischemic changes. CXR clear and troponin < 2. No abnormalities on CBC and BMP. Heart score of 1; do not feel pt requires repeat troponin given pain has been on and off for 2 weeks. Pt is PERC negative. He does admit to increased anxiety as of late due to stressors at work. His symptoms are less concerning for ACS given heart score of 1 and reassuring work up. He denies radiation of pain/ripping/tearing to suggest dissection and given overall well appearance today I have lower suspicion for same. Do recommend patient follow up with both his psychiatrist and his PCP regarding ED visit today as he may need to have additional anti anxiety medication added to his regimen. Pt instructed to return to the ED for any new/worsening symptoms. Pt in agreement with plan and stable for discharge home.   This note was prepared using Dragon voice recognition software and may include unintentional dictation errors due to the inherent  limitations of voice recognition software.   Final Clinical Impression(s) / ED Diagnoses Final diagnoses:  Nonspecific chest pain  Anxiety    Rx / DC Orders ED Discharge Orders     None        Discharge Instructions      Your workup was overall reassuring today without any abnormalities appreciated.  I would recommend following up with both your PCP and your psychiatrist regarding ED visit and to discuss potential need for additional anxiety medications  Return to the ED IMMEDIATELY for any new/worsening symptoms including worsening chest pain, shortness of breath with chest pain, vomiting with pain, passing out, or any other new/concerning symptoms       Tanda Rockers, PA-C 05/15/21 1623    Charlynne Pander, MD 05/21/21 2122

## 2021-05-15 NOTE — ED Triage Notes (Signed)
Pt c/o intermittent CP x 1 week-states "my anxiety level is the highest it's ever been" x 2 weeks with hx of panic attacks-pt NAD-steady gait

## 2021-05-15 NOTE — Discharge Instructions (Addendum)
Your workup was overall reassuring today without any abnormalities appreciated.  I would recommend following up with both your PCP and your psychiatrist regarding ED visit and to discuss potential need for additional anxiety medications  Return to the ED IMMEDIATELY for any new/worsening symptoms including worsening chest pain, shortness of breath with chest pain, vomiting with pain, passing out, or any other new/concerning symptoms

## 2021-05-16 ENCOUNTER — Telehealth: Payer: Self-pay | Admitting: Physician Assistant

## 2021-05-16 ENCOUNTER — Other Ambulatory Visit: Payer: Self-pay

## 2021-05-16 MED ORDER — FLUOXETINE HCL 20 MG PO CAPS: 20.0000 mg | ORAL_CAPSULE | Freq: Every day | ORAL | 1 refills | Status: DC

## 2021-05-16 NOTE — Telephone Encounter (Signed)
Zack returned Toni's call  Pls call back  before 5pm

## 2021-05-16 NOTE — Telephone Encounter (Signed)
LVM to return call.

## 2021-05-16 NOTE — Telephone Encounter (Signed)
Please add to cancellation list

## 2021-05-16 NOTE — Telephone Encounter (Signed)
Next visit is 06/25/21. Robert Robbins called and said that his Prozac was increased from 60-80 mg but Robert Robbins said that he doesn't feel any difference increasing it to 20 mg. He feels tired more often than usual and gets sleepy in the middle of the day. His Prozac 80 mg works good. He wonders if he needs to make an adjustment on the Prozac? His phone number is [phone].

## 2021-05-16 NOTE — Telephone Encounter (Signed)
Please review

## 2021-05-16 NOTE — Telephone Encounter (Signed)
Please clarify the msg. Does he see benefit from increasing or not?  If he does, change the time of day he takes it, from day to bedtime for ex.  If not helping him more, ok to decrease back to 60 mg.

## 2021-05-16 NOTE — Telephone Encounter (Signed)
I will send in 20 mg prozac to take along with the 40.He stated he did not see a difference in the increase.Pt also stated his anxiety is increased and out of control at times.He does not like taking the xanax and wants to know if anything can be added on a as needed to basis to manage.

## 2021-05-17 NOTE — Telephone Encounter (Signed)
I'll send in Hydroxyzine for prn use. Please get pharmacy. Thanks

## 2021-05-19 ENCOUNTER — Other Ambulatory Visit: Payer: Self-pay | Admitting: Physician Assistant

## 2021-05-19 MED ORDER — HYDROXYZINE HCL 25 MG PO TABS: 12.5000 mg | ORAL_TABLET | Freq: Three times a day (TID) | ORAL | 0 refills | Status: DC | PRN

## 2021-05-19 NOTE — Telephone Encounter (Signed)
Prescription for hydroxyzine 25 mg, 1/2-1 p.o. 3 times daily as needed was sent.

## 2021-05-19 NOTE — Telephone Encounter (Signed)
[id number] - [place], [place] - [address]

## 2021-06-05 DIAGNOSIS — Z23 Encounter for immunization: Secondary | ICD-10-CM | POA: Diagnosis not present

## 2021-06-05 DIAGNOSIS — F419 Anxiety disorder, unspecified: Secondary | ICD-10-CM | POA: Diagnosis not present

## 2021-06-05 DIAGNOSIS — Z131 Encounter for screening for diabetes mellitus: Secondary | ICD-10-CM | POA: Diagnosis not present

## 2021-06-05 DIAGNOSIS — E663 Overweight: Secondary | ICD-10-CM | POA: Diagnosis not present

## 2021-06-05 DIAGNOSIS — Z13220 Encounter for screening for lipoid disorders: Secondary | ICD-10-CM | POA: Diagnosis not present

## 2021-06-05 DIAGNOSIS — Z0000 Encounter for general adult medical examination without abnormal findings: Secondary | ICD-10-CM | POA: Diagnosis not present

## 2021-06-05 DIAGNOSIS — R5383 Other fatigue: Secondary | ICD-10-CM | POA: Diagnosis not present

## 2021-06-09 ENCOUNTER — Other Ambulatory Visit: Payer: Self-pay | Admitting: Physician Assistant

## 2021-06-25 ENCOUNTER — Encounter: Payer: Self-pay | Admitting: Physician Assistant

## 2021-06-25 ENCOUNTER — Telehealth (INDEPENDENT_AMBULATORY_CARE_PROVIDER_SITE_OTHER): Payer: BC Managed Care – PPO | Admitting: Physician Assistant

## 2021-06-25 DIAGNOSIS — F429 Obsessive-compulsive disorder, unspecified: Secondary | ICD-10-CM

## 2021-06-25 DIAGNOSIS — F4010 Social phobia, unspecified: Secondary | ICD-10-CM | POA: Diagnosis not present

## 2021-06-25 DIAGNOSIS — F32A Depression, unspecified: Secondary | ICD-10-CM

## 2021-06-25 MED ORDER — VORTIOXETINE HBR 20 MG PO TABS: 20.0000 mg | ORAL_TABLET | Freq: Every day | ORAL | 1 refills | Status: DC

## 2021-06-25 MED ORDER — ALPRAZOLAM 0.5 MG PO TABS: 0.5000 mg | ORAL_TABLET | Freq: Two times a day (BID) | ORAL | 1 refills | Status: DC | PRN

## 2021-06-25 NOTE — Progress Notes (Signed)
[facility]  Patient ID: Robert Robbins,  MRN: [id number]  PCP: Gweneth Dimitri, MD  Date of Evaluation: 06/25/2021  time spent:30 minutes  Chief Complaint:  Chief Complaint   Depression; Anxiety; Follow-up    Virtual Visit via Telehealth  I connected with patient by telephone (I was unable to connect via video so telephone visit was necessary.)  With their informed consent, and verified patient privacy and that I am speaking with the correct person using two identifiers.  I am private, in my office and the patient is at work.  I discussed the limitations, risks, security and privacy concerns of performing an evaluation and management service by telephone and the availability of in person appointments. I also discussed with the patient that there may be a patient responsible charge related to this service. The patient expressed understanding and agreed to proceed.   I discussed the assessment and treatment plan with the patient. The patient was provided an opportunity to ask questions and all were answered. The patient agreed with the plan and demonstrated an understanding of the instructions.   The patient was advised to call back or seek an in-person evaluation if the symptoms worsen or if the condition fails to improve as anticipated.  I provided 30 minutes of non-face-to-face time during this encounter.   HISTORY/CURRENT STATUS: HPI For routine med check.  Tired a lot during the middle of the day. Started about 9 months ago. Feels like taking a nap just every day, but doesn't.  Sleeps well at night.  Not affecting work. Would like to stay in bed all the time, if he could. Does enjoy things, doesn't cancel plans.  No problems with focus and concentration.  Not crying easily.  A few months back he dropped the Prozac dose to 60 mg, thinking it was making him more anxious, he realized it was the caffeine though.  Denies suicidal or homicidal thoughts.   Still has anxiety but  not taking the Xanax very often.  It is still very helpful when he takes it.  He was having a lot more anxiety over the summer and stopped caffeine which did help.  Still not drinking any alcohol.  Patient denies increased energy with decreased need for sleep, no increased talkativeness, no racing thoughts, no impulsivity or risky behaviors, no increased spending, no increased libido, no grandiosity, no increased irritability or anger, and no hallucinations.  Denies dizziness, syncope, seizures, numbness, tingling, tremor, tics, unsteady gait, slurred speech, confusion. Denies muscle or joint pain, stiffness, or dystonia.  Individual Medical History/ Review of Systems: Changes? :Yes    went to ER 05/15/2021 for chest pain.  Records reviewed.  Past medications for mental health diagnoses include: Propranolol, Lexapro, Prozac, Xanax  Allergies: Patient has no known allergies.  Current Medications:  Current Outpatient Medications:    FLUoxetine (PROZAC) 20 MG capsule, Take 1 capsule (20 mg total) by mouth daily. Take with 40 mg tab to = 60 mg., Disp: 30 capsule, Rfl: 1   levocetirizine (XYZAL) 5 MG tablet, Take 5 mg by mouth every evening., Disp: , Rfl:    vortioxetine HBr (TRINTELLIX) 20 MG TABS tablet, Take 1 tablet (20 mg total) by mouth daily., Disp: 30 tablet, Rfl: 1   ALPRAZolam (XANAX) 0.5 MG tablet, Take 1 tablet (0.5 mg total) by mouth 2 (two) times daily as needed for anxiety (usually only 1-2 per week)., Disp: 30 tablet, Rfl: 1   Ascorbic Acid (VITAMIN C) 100 MG tablet, Take 100 mg by  mouth daily. (Patient not taking: No sig reported), Disp: , Rfl:    hydrOXYzine (ATARAX/VISTARIL) 25 MG tablet, Take 0.5-1 tablets (12.5-25 mg total) by mouth every 8 (eight) hours as needed. (Patient not taking: Reported on 06/25/2021), Disp: 60 tablet, Rfl: 0 Medication Side Effects: none  Family Medical/ Social History: Changes? See HPI  MENTAL HEALTH EXAM:  There were no vitals taken for this  visit.There is no height or weight on file to calculate BMI.  General Appearance:  Unable to assess  Eye Contact:   Unable to assess  Speech:  Clear and Coherent and Normal Rate  Volume:  Normal  Mood:  Euthymic  Affect:   Unable to assess  Thought Process:  Goal Directed and Descriptions of Associations: Intact  Orientation:  Full (Time, Place, and Person)  Thought Content: Logical   Suicidal Thoughts:  No  Homicidal Thoughts:  No  Memory:  WNL  Judgement:  Good  Insight:  Good  Psychomotor Activity:   Unable to assess  Concentration:  Concentration: Good  Recall:  Good  Fund of Knowledge: Good  Language: Good  Assets:  Desire for Improvement  ADL's:  Intact  Cognition: WNL  Prognosis:  Good    DIAGNOSES:    ICD-10-CM   1. Obsessive-compulsive disorder, unspecified type  F42.9     2. Social anxiety disorder  F40.10     3. Mild depression  F32.A        Receiving Psychotherapy: Yes  Involved in a men's group through church.   RECOMMENDATIONS:  PDMP was reviewed.  Xanax filled 06/27/2020.  No other controlled substances. I provided 30 minutes of non-face-to-face time during this encounter, including time spent before and after the visit in records review, medical decision making, and charting.  Discussed the fatigue, not wanting to get out of bed, not a lot of motivation, recommend changing Prozac to Trintellix. Side effect profile is good.  Benefits and risks were discussed. Continue no caffeine use.  Start Trintellix 20 mg once it is approved through insurance.  He should not take it until he is on the Prozac 20 mg or the week later is fine.  2 Decrease Prozac to 40 mg daily for 1 week and then decrease to 20 mg daily for 1 week and then stop. Continue Xanax 0.5 mg 1 twice daily as needed.  Return in 6 weeks.  Melony Overly, PA-C

## 2021-07-07 ENCOUNTER — Telehealth: Payer: Self-pay

## 2021-07-07 ENCOUNTER — Other Ambulatory Visit: Payer: Self-pay | Admitting: Physician Assistant

## 2021-07-07 MED ORDER — VILAZODONE HCL 10 MG PO TABS: 10.0000 mg | ORAL_TABLET | Freq: Every day | ORAL | 0 refills | Status: DC

## 2021-07-07 NOTE — Telephone Encounter (Signed)
Pt LM on VM checking status on PA

## 2021-07-07 NOTE — Telephone Encounter (Signed)
Pt stated after researching trintellix vs viibryd he thinks viibryd will be better for him long term because its generic and does not have as many side effects as trintellix.He wants to know you opinion before he picks up trintellix.

## 2021-07-07 NOTE — Telephone Encounter (Signed)
Viibryd is a good choice also. Which pharmacy and I'll send it in.  Please cancel the Trintellix prescription.  Thanks.

## 2021-07-07 NOTE — Telephone Encounter (Signed)
Rx sent 

## 2021-07-07 NOTE — Telephone Encounter (Signed)
Prior Authorization submitted and approved for TRINTELLIX 20 MG effective 07/07/2021-07/06/2022 ID# [id number] [facility]

## 2021-07-07 NOTE — Telephone Encounter (Signed)
Please call pt and find out if they want the viibryd sent to the same pharmacy as trintellix,and then respond to teresa with the pharmacy.Then call pharmacy and cancel trintellix

## 2021-07-07 NOTE — Telephone Encounter (Addendum)
Patient does want to go with Viibryd instead of the Trintellix. He wants to use the same pharmacy, [facility], [place].  Pharmacy called to cancel Trintellix.

## 2021-08-11 ENCOUNTER — Telehealth: Payer: Self-pay | Admitting: Physician Assistant

## 2021-08-11 ENCOUNTER — Other Ambulatory Visit: Payer: Self-pay

## 2021-08-11 MED ORDER — VILAZODONE HCL 10 MG PO TABS
10.0000 mg | ORAL_TABLET | Freq: Every day | ORAL | 0 refills | Status: DC
Start: 2021-08-11 — End: 2021-09-03

## 2021-08-11 NOTE — Telephone Encounter (Signed)
Pt informed and rx sent 

## 2021-08-11 NOTE — Telephone Encounter (Signed)
Please call him and let him know he was supposed to have made a follow-up appointment in 6 weeks after our last visit.  The purpose of the visit is to see how he is doing on the Viibryd and whether the current dose is adequate or not. Please send a 30-day supply of the current dose of Viibryd, with no refills.  Thank you

## 2021-08-11 NOTE — Telephone Encounter (Signed)
Pt Robert Robbins stating he would like refill of Viibryd.  He did have upset stomach the first couple weeks, but may have been a bug going around in his family.  He doesn't know if [name] needs to see him or if it needs to be increased, otherwise, he would just like a refill sent.  No upcoming appt; last appt 10/5

## 2021-08-11 NOTE — Telephone Encounter (Signed)
Please review and let me know if you want to change the dose

## 2021-09-03 ENCOUNTER — Ambulatory Visit (INDEPENDENT_AMBULATORY_CARE_PROVIDER_SITE_OTHER): Payer: BC Managed Care – PPO | Admitting: Physician Assistant

## 2021-09-03 ENCOUNTER — Other Ambulatory Visit: Payer: Self-pay | Admitting: Physician Assistant

## 2021-09-03 ENCOUNTER — Encounter: Payer: Self-pay | Admitting: Physician Assistant

## 2021-09-03 DIAGNOSIS — F411 Generalized anxiety disorder: Secondary | ICD-10-CM | POA: Diagnosis not present

## 2021-09-03 DIAGNOSIS — F429 Obsessive-compulsive disorder, unspecified: Secondary | ICD-10-CM | POA: Diagnosis not present

## 2021-09-03 DIAGNOSIS — F4010 Social phobia, unspecified: Secondary | ICD-10-CM | POA: Diagnosis not present

## 2021-09-03 MED ORDER — VILAZODONE HCL 20 MG PO TABS: 20.0000 mg | ORAL_TABLET | Freq: Every day | ORAL | 1 refills | Status: DC

## 2021-09-03 NOTE — Progress Notes (Signed)
[facility]  Patient ID: Robert Robbins,  MRN: [id number]  PCP: Gweneth Dimitri, MD  Date of Evaluation: 09/03/2021  time spent:25 minutes  Chief Complaint:  Chief Complaint   Anxiety; Follow-up    Virtual Visit via Telehealth  I connected with patient by telephone, with their informed consent, and verified patient privacy and that I am speaking with the correct person using two identifiers.  I am private, in my office and the patient is work.  I discussed the limitations, risks, security and privacy concerns of performing an evaluation and management service by telephone and the availability of in person appointments. I also discussed with the patient that there may be a patient responsible charge related to this service. The patient expressed understanding and agreed to proceed.   I discussed the assessment and treatment plan with the patient. The patient was provided an opportunity to ask questions and all were answered. The patient agreed with the plan and demonstrated an understanding of the instructions.   The patient was advised to call back or seek an in-person evaluation if the symptoms worsen or if the condition fails to improve as anticipated.  I provided 25 minutes of non-face-to-face time during this encounter.  HISTORY/CURRENT STATUS: HPI For routine med check.  Two mo ago, weaned off Prozac and Trintellix was Rx, trouble getting it d/t insurance, so switched to [employer].  Not feeling depressed at all, not wanting to stay in bed a lot, able to enjoy things.  Energy and motivation are pretty good.  Not isolating.  Work is going well.  Sleeps well.  No suicidal or homicidal thoughts.  He still has anxiety though but is not needing the Xanax or hydroxyzine.  His wife tells him still has irritability, which he says is how the anxiety manifests itself.  Not having panic attacks but more so a generalized sense of unease.  No physical symptoms of anxiety such as  palpitations, chest pain, or shortness of breath.  Denies dizziness, syncope, seizures, numbness, tingling, tremor, tics, unsteady gait, slurred speech, confusion. Denies muscle or joint pain, stiffness, or dystonia.  Individual Medical History/ Review of Systems: Changes? :No      Past medications for mental health diagnoses include: Propranolol, Lexapro, Prozac, Xanax  Allergies: Patient has no known allergies.  Current Medications:  Current Outpatient Medications:    levocetirizine (XYZAL) 5 MG tablet, Take 5 mg by mouth every evening., Disp: , Rfl:    Vilazodone HCl 20 MG TABS, Take 1 tablet (20 mg total) by mouth daily., Disp: 30 tablet, Rfl: 1   ALPRAZolam (XANAX) 0.5 MG tablet, Take 1 tablet (0.5 mg total) by mouth 2 (two) times daily as needed for anxiety (usually only 1-2 per week). (Patient not taking: Reported on 09/03/2021), Disp: 30 tablet, Rfl: 1   Ascorbic Acid (VITAMIN C) 100 MG tablet, Take 100 mg by mouth daily. (Patient not taking: Reported on 07/24/2020), Disp: , Rfl:    hydrOXYzine (ATARAX/VISTARIL) 25 MG tablet, Take 0.5-1 tablets (12.5-25 mg total) by mouth every 8 (eight) hours as needed. (Patient not taking: Reported on 06/25/2021), Disp: 60 tablet, Rfl: 0 Medication Side Effects: none  Family Medical/ Social History: Changes? See HPI  MENTAL HEALTH EXAM:  There were no vitals taken for this visit.There is no height or weight on file to calculate BMI.  General Appearance:  Unable to assess  Eye Contact:   Unable to assess  Speech:  Clear and Coherent and Normal Rate  Volume:  Normal  Mood:  Euthymic  Affect:   Unable to assess  Thought Process:  Goal Directed and Descriptions of Associations: Circumstantial  Orientation:  Full (Time, Place, and Person)  Thought Content: Logical   Suicidal Thoughts:  No  Homicidal Thoughts:  No  Memory:  WNL  Judgement:  Good  Insight:  Good  Psychomotor Activity:   Unable to assess  Concentration:  Concentration: Good   Recall:  Good  Fund of Knowledge: Good  Language: Good  Assets:  Desire for Improvement  ADL's:  Intact  Cognition: WNL  Prognosis:  Good    DIAGNOSES:    ICD-10-CM   1. Generalized anxiety disorder  F41.1     2. Obsessive-compulsive disorder, unspecified type  F42.9     3. Social anxiety disorder  F40.10        Receiving Psychotherapy: Yes  Involved in a men's group through church.   RECOMMENDATIONS:  PDMP was reviewed.  Last Xanax filled 06/25/2021. I provided 25 minutes of non-face-to-face time during this encounter, including time spent before and after the visit in records review, medical decision making, counseling pertinent to today's visit, and charting.  He is at the lowest dose of Viibryd so recommend increasing that.  It is encouraging that his symptoms of depression have improved, we just need to get the anxiety better controlled. Increase Viibryd to 20 mg p.o. daily. Continue hydroxyzine 25 mg, 1/2-1 every 8 hours as needed.  He only took 1 or 2 pills in the beginning. Continue Xanax 0.5 mg 1 twice daily as needed.  Has not taken it in a while. Return in 6 weeks.  Melony Overly, PA-C

## 2021-10-13 ENCOUNTER — Ambulatory Visit (INDEPENDENT_AMBULATORY_CARE_PROVIDER_SITE_OTHER): Payer: BC Managed Care – PPO | Admitting: Physician Assistant

## 2021-10-13 ENCOUNTER — Encounter: Payer: Self-pay | Admitting: Physician Assistant

## 2021-10-13 ENCOUNTER — Other Ambulatory Visit: Payer: Self-pay | Admitting: Physician Assistant

## 2021-10-13 DIAGNOSIS — F429 Obsessive-compulsive disorder, unspecified: Secondary | ICD-10-CM | POA: Diagnosis not present

## 2021-10-13 DIAGNOSIS — F411 Generalized anxiety disorder: Secondary | ICD-10-CM

## 2021-10-13 DIAGNOSIS — F4010 Social phobia, unspecified: Secondary | ICD-10-CM

## 2021-10-13 MED ORDER — VILAZODONE HCL 40 MG PO TABS: 40.0000 mg | ORAL_TABLET | Freq: Every day | ORAL | 1 refills | Status: DC

## 2021-10-13 NOTE — Progress Notes (Signed)
[facility]  Patient ID: Robert Robbins,  MRN: [id number]  PCP: Gweneth Dimitri, MD  Date of Evaluation: 10/13/2021 time spent:25 minutes  Chief Complaint:  Chief Complaint   Anxiety; Follow-up     Virtual Visit via Telehealth  I connected with patient by telephone, with their informed consent, and verified patient privacy and that I am speaking with the correct person using two identifiers.  I am private, in my office and the patient is work.  I discussed the limitations, risks, security and privacy concerns of performing an evaluation and management service by telephone and the availability of in person appointments. I also discussed with the patient that there may be a patient responsible charge related to this service. The patient expressed understanding and agreed to proceed.   I discussed the assessment and treatment plan with the patient. The patient was provided an opportunity to ask questions and all were answered. The patient agreed with the plan and demonstrated an understanding of the instructions.   The patient was advised to call back or seek an in-person evaluation if the symptoms worsen or if the condition fails to improve as anticipated.  I provided 25 minutes of non-face-to-face time during this encounter.  HISTORY/CURRENT STATUS: HPI For routine med check.  At LOV 6 weeks ago, we increased the Viibryd from 10 to 20 mg.  He can tell a difference, especially in the anxiety but wonders if he could feel even better.  He is not having to take the Xanax, hardly at all.  He might have taken 1 pill since our last visit.  He would say he is about 50% better.  Not having panic attacks.  Sleeps pretty good.  Work is going fine.  Patient denies loss of interest in usual activities and is able to enjoy things.  Denies decreased energy or motivation.  Appetite has not changed.  No extreme sadness, tearfulness, or feelings of hopelessness.  Denies any changes in  concentration, making decisions or remembering things.  Denies suicidal or homicidal thoughts.  Denies dizziness, syncope, seizures, numbness, tingling, tremor, tics, unsteady gait, slurred speech, confusion. Denies muscle or joint pain, stiffness, or dystonia.  Individual Medical History/ Review of Systems: Changes? :No      Past medications for mental health diagnoses include: Propranolol, Lexapro, Prozac, Xanax  Allergies: Patient has no known allergies.  Current Medications:  Current Outpatient Medications:    ALPRAZolam (XANAX) 0.5 MG tablet, Take 1 tablet (0.5 mg total) by mouth 2 (two) times daily as needed for anxiety (usually only 1-2 per week)., Disp: 30 tablet, Rfl: 1   levocetirizine (XYZAL) 5 MG tablet, Take 5 mg by mouth every evening., Disp: , Rfl:    Vilazodone HCl (VIIBRYD) 40 MG TABS, Take 1 tablet (40 mg total) by mouth daily., Disp: 30 tablet, Rfl: 1   Ascorbic Acid (VITAMIN C) 100 MG tablet, Take 100 mg by mouth daily. (Patient not taking: Reported on 07/24/2020), Disp: , Rfl:  Medication Side Effects: none  Family Medical/ Social History: Changes? See HPI  MENTAL HEALTH EXAM:  There were no vitals taken for this visit.There is no height or weight on file to calculate BMI.  General Appearance:  Unable to assess  Eye Contact:   Unable to assess  Speech:  Clear and Coherent and Normal Rate  Volume:  Normal  Mood:  Euthymic  Affect:   Unable to assess  Thought Process:  Goal Directed and Descriptions of Associations: Circumstantial  Orientation:  Full (Time, Place,  and Person)  Thought Content: Logical   Suicidal Thoughts:  No  Homicidal Thoughts:  No  Memory:  WNL  Judgement:  Good  Insight:  Good  Psychomotor Activity:   Unable to assess  Concentration:  Concentration: Good  Recall:  Good  Fund of Knowledge: Good  Language: Good  Assets:  Desire for Improvement Financial Resources/Insurance Housing Transportation Vocational/Educational  ADL's:   Intact  Cognition: WNL  Prognosis:  Good    DIAGNOSES:    ICD-10-CM   1. Generalized anxiety disorder  F41.1     2. Obsessive-compulsive disorder, unspecified type  F42.9     3. Social anxiety disorder  F40.10         Receiving Psychotherapy: Yes  Involved in a men's group through church.   RECOMMENDATIONS:  PDMP was reviewed.  Last Xanax filled 06/25/2021. I provided 25 minutes of non-face-to-face time during this encounter, including time spent before and after the visit in records review, medical decision making, counseling pertinent to today's visit, and charting.  We agreed to increase the Viibryd with the goal of even more improvement of anxiety.  He would like to try it.  He will call if he has any side effects or problems arise with the increased dose. Increase the Viibryd to 40 mg, 1 p.o. daily.  Continue Xanax 0.5 mg 1 twice daily as needed. Takes it very rarely. Return in 4 to 6 weeks.  Melony Overly, PA-C

## 2021-10-27 DIAGNOSIS — N451 Epididymitis: Secondary | ICD-10-CM | POA: Diagnosis not present

## 2021-11-08 ENCOUNTER — Other Ambulatory Visit: Payer: Self-pay | Admitting: Physician Assistant

## 2021-11-09 ENCOUNTER — Other Ambulatory Visit: Payer: Self-pay | Admitting: Physician Assistant

## 2021-11-24 ENCOUNTER — Encounter: Payer: Self-pay | Admitting: Physician Assistant

## 2021-11-24 ENCOUNTER — Ambulatory Visit (INDEPENDENT_AMBULATORY_CARE_PROVIDER_SITE_OTHER): Payer: BC Managed Care – PPO | Admitting: Physician Assistant

## 2021-11-24 DIAGNOSIS — F4010 Social phobia, unspecified: Secondary | ICD-10-CM | POA: Diagnosis not present

## 2021-11-24 DIAGNOSIS — F32A Depression, unspecified: Secondary | ICD-10-CM | POA: Diagnosis not present

## 2021-11-24 DIAGNOSIS — F429 Obsessive-compulsive disorder, unspecified: Secondary | ICD-10-CM | POA: Diagnosis not present

## 2021-11-24 MED ORDER — VILAZODONE HCL 40 MG PO TABS: 40.0000 mg | ORAL_TABLET | Freq: Every day | ORAL | 0 refills | Status: DC

## 2021-11-24 NOTE — Progress Notes (Signed)
[facility] ? ?Patient ID: Colon Branch,  ?MRN: [id number] ? ?PCP: Gweneth Dimitri, MD ? ?Date of Evaluation: 11/24/2021 ?time spent:25 minutes ? ?Chief Complaint:  ?Chief Complaint   ?Anxiety; Depression; Follow-up ?  ? ? ?Virtual Visit via Telehealth ? ?I connected with patient by telephone, with their informed consent, and verified patient privacy and that I am speaking with the correct person using two identifiers.  I am private, in my office and the patient is work. ? ?I discussed the limitations, risks, security and privacy concerns of performing an evaluation and management service by telephone and the availability of in person appointments. I also discussed with the patient that there may be a patient responsible charge related to this service. The patient expressed understanding and agreed to proceed. ?  ?I discussed the assessment and treatment plan with the patient. The patient was provided an opportunity to ask questions and all were answered. The patient agreed with the plan and demonstrated an understanding of the instructions. ?  ?The patient was advised to call back or seek an in-person evaluation if the symptoms worsen or if the condition fails to improve as anticipated. ? ?I provided 25 minutes of non-face-to-face time during this encounter. ? ?HISTORY/CURRENT STATUS: ?HPI For routine med check. ? ?We increased the Viibryd 6 weeks ago. States he's feeling about 50% better since increasing it again.  He is happy with the way he is feeling, feels that mood is stable.  He has only had to take Xanax maybe once if that since our last visit.  That was before a presentation at work.  Not having panic attacks.  Not having obsessions like he did. ? ?Patient denies loss of interest in usual activities and is able to enjoy things.  Denies decreased energy or motivation.  Appetite has not changed.  No extreme sadness, tearfulness, or feelings of hopelessness.  Denies any changes in concentration,  making decisions or remembering things.  He sleeps well.  Denies suicidal or homicidal thoughts. ? ?Denies dizziness, syncope, seizures, numbness, tingling, tremor, tics, unsteady gait, slurred speech, confusion. Denies muscle or joint pain, stiffness, or dystonia. ? ?Individual Medical History/ Review of Systems: Changes? :No     ? ?Past medications for mental health diagnoses include: ?Propranolol, Lexapro, Prozac, Xanax, Viibryd ? ?Allergies: Patient has no known allergies. ? ?Current Medications:  ?Current Outpatient Medications:  ?  levocetirizine (XYZAL) 5 MG tablet, Take 5 mg by mouth every evening., Disp: , Rfl:  ?  ALPRAZolam (XANAX) 0.5 MG tablet, Take 1 tablet (0.5 mg total) by mouth 2 (two) times daily as needed for anxiety (usually only 1-2 per week). (Patient not taking: Reported on 11/24/2021), Disp: 30 tablet, Rfl: 1 ?  Ascorbic Acid (VITAMIN C) 100 MG tablet, Take 100 mg by mouth daily. (Patient not taking: Reported on 07/24/2020), Disp: , Rfl:  ?  Vilazodone HCl (VIIBRYD) 40 MG TABS, Take 1 tablet (40 mg total) by mouth daily., Disp: 90 tablet, Rfl: 0 ?Medication Side Effects: none ? ?Family Medical/ Social History: Changes? See HPI ? ?MENTAL HEALTH EXAM: ? ?There were no vitals taken for this visit.There is no height or weight on file to calculate BMI.  ?General Appearance:  Unable to assess  ?Eye Contact:   Unable to assess  ?Speech:  Clear and Coherent and Normal Rate  ?Volume:  Normal  ?Mood:  Euthymic  ?Affect:   Unable to assess  ?Thought Process:  Goal Directed and Descriptions of Associations: Intact  ?Orientation:  Full (  Time, Place, and Person)  ?Thought Content: Logical   ?Suicidal Thoughts:  No  ?Homicidal Thoughts:  No  ?Memory:  WNL  ?Judgement:  Good  ?Insight:  Good  ?Psychomotor Activity:   Unable to assess  ?Concentration:  Concentration: Good  ?Recall:  Good  ?Fund of Knowledge: Good  ?Language: Good  ?Assets:  Desire for Improvement ?Financial  Resources/Insurance ?Housing ?Transportation ?Vocational/Educational  ?ADL's:  Intact  ?Cognition: WNL  ?Prognosis:  Good  ? ? ?DIAGNOSES:  ?  ICD-10-CM   ?1. Social anxiety disorder  F40.10   ?  ?2. Mild depression  F32.A   ?  ?3. Obsessive-compulsive disorder, unspecified type  F42.9   ?  ? ? ? ?Receiving Psychotherapy: Yes  Involved in a men's group through church. ? ? ?RECOMMENDATIONS:  ?PDMP reviewed.  Xanax filled 06/25/2021. ?I provided 25 minutes of non-face-to-face time during this encounter, including time spent before and after the visit in records review, medical decision making, counseling pertinent to today's visit, and charting.  ?I am glad to see that he is doing better!  No changes need to be made. ?He did ask about BuSpar, his wife and daughter both take it.  He wonders if that might be an option for him if the anxiety worsens.  Yes that is an option. ? ?Continue Viibryd 40 mg, 1 p.o. daily.  ?Continue Xanax 0.5 mg 1 twice daily as needed. Takes it very rarely. ?Return in 3 months. ? ?Melony Overly, PA-C  ?

## 2022-03-02 ENCOUNTER — Encounter: Payer: Self-pay | Admitting: Physician Assistant

## 2022-03-02 ENCOUNTER — Ambulatory Visit (INDEPENDENT_AMBULATORY_CARE_PROVIDER_SITE_OTHER): Payer: BC Managed Care – PPO | Admitting: Physician Assistant

## 2022-03-02 ENCOUNTER — Other Ambulatory Visit: Payer: Self-pay | Admitting: Physician Assistant

## 2022-03-02 DIAGNOSIS — F32A Depression, unspecified: Secondary | ICD-10-CM

## 2022-03-02 DIAGNOSIS — F4010 Social phobia, unspecified: Secondary | ICD-10-CM | POA: Diagnosis not present

## 2022-03-02 DIAGNOSIS — F429 Obsessive-compulsive disorder, unspecified: Secondary | ICD-10-CM

## 2022-03-02 MED ORDER — VILAZODONE HCL 40 MG PO TABS: 40.0000 mg | ORAL_TABLET | Freq: Every day | ORAL | 1 refills | Status: DC

## 2022-03-02 MED ORDER — ALPRAZOLAM 0.5 MG PO TABS: 0.5000 mg | ORAL_TABLET | Freq: Two times a day (BID) | ORAL | 1 refills | Status: DC | PRN

## 2022-03-02 NOTE — Telephone Encounter (Signed)
Pt requested refill of Xanax to   [id number] - [place], [place] - [address] AT [facility] [address]  [address], [place] [address]  Phone:  [phone]  Fax:  [phone]   Next appt 12/11

## 2022-03-02 NOTE — Progress Notes (Signed)
[facility]  Patient ID: Robert Robbins,  MRN: [id number]  PCP: Gweneth Dimitri, MD  Date of Evaluation: 03/02/2022 time spent:25 minutes  Chief Complaint:  Chief Complaint   Anxiety; Depression; Follow-up    Virtual Visit via Telehealth  I connected with patient by telephone, with their informed consent, and verified patient privacy and that I am speaking with the correct person using two identifiers.  I am private, in my office and the patient is at work.  I discussed the limitations, risks, security and privacy concerns of performing an evaluation and management service by telephone and the availability of in person appointments. I also discussed with the patient that there may be a patient responsible charge related to this service. The patient expressed understanding and agreed to proceed.   I discussed the assessment and treatment plan with the patient. The patient was provided an opportunity to ask questions and all were answered. The patient agreed with the plan and demonstrated an understanding of the instructions.   The patient was advised to call back or seek an in-person evaluation if the symptoms worsen or if the condition fails to improve as anticipated.  I provided 25 minutes of non-face-to-face time during this encounter.  HISTORY/CURRENT STATUS: HPI For routine med check.  Doing well as far as anxiety and depression. Does have to take Xanax occasionally, before a meeting or something.  It is still effective.  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  Sometimes he will get something on his mind and cannot let it go but not a big problem now.  His wife is wondering whether he has ADHD or not.  He gets excited about projects, will learn everything there is to know about something and then not want to do it anymore.  Until the next thing comes along.  This has been a pattern for  him.  He did okay when he was in school but did get bored with it.  No teachers ever mentioned the possibility of ADHD that he knows of.  He does have several projects going on in his life at a time, his mind wanders easily and he forgets what people tell him, he feels that it is due to not being able to focus on what people are saying to him.  Patient denies loss of interest in usual activities and is able to enjoy things.  Denies decreased energy or motivation.  Appetite has not changed.  No extreme sadness, tearfulness, or feelings of hopelessness.  He sleeps well.  Denies suicidal or homicidal thoughts.  Denies dizziness, syncope, seizures, numbness, tingling, tremor, tics, unsteady gait, slurred speech, confusion. Denies muscle or joint pain, stiffness, or dystonia.  Individual Medical History/ Review of Systems: Changes? :No      Past medications for mental health diagnoses include: Propranolol, Lexapro, Prozac, Xanax, Viibryd  Allergies: Patient has no known allergies.  Current Medications:  Current Outpatient Medications:    ALPRAZolam (XANAX) 0.5 MG tablet, Take 1 tablet (0.5 mg total) by mouth 2 (two) times daily as needed for anxiety (usually only 1-2 per week)., Disp: 30 tablet, Rfl: 1   levocetirizine (XYZAL) 5 MG tablet, Take 5 mg by mouth every evening., Disp: , Rfl:    Ascorbic Acid (VITAMIN C) 100 MG tablet, Take 100 mg by mouth daily. (Patient not taking: Reported on 07/24/2020), Disp: , Rfl:    Vilazodone HCl (VIIBRYD)  40 MG TABS, Take 1 tablet (40 mg total) by mouth daily., Disp: 90 tablet, Rfl: 1 Medication Side Effects: none  Family Medical/ Social History: Changes? See HPI  MENTAL HEALTH EXAM:  There were no vitals taken for this visit.There is no height or weight on file to calculate BMI.  General Appearance:  Unable to assess  Eye Contact:   Unable to assess  Speech:  Clear and Coherent and Normal Rate  Volume:  Normal  Mood:  Euthymic  Affect:   Unable to  assess  Thought Process:  Goal Directed and Descriptions of Associations: Intact  Orientation:  Full (Time, Place, and Person)  Thought Content: Logical   Suicidal Thoughts:  No  Homicidal Thoughts:  No  Memory:  WNL  Judgement:  Good  Insight:  Good  Psychomotor Activity:   Unable to assess  Concentration:  Concentration: Good and Attention Span: Fair  Recall:  Good  Fund of Knowledge: Good  Language: Good  Assets:  Desire for Improvement Financial Resources/Insurance Housing Transportation Vocational/Educational  ADL's:  Intact  Cognition: WNL  Prognosis:  Good   DIAGNOSES:    ICD-10-CM   1. Mild depression  F32.A     2. Obsessive-compulsive disorder, unspecified type  F42.9     3. Social anxiety disorder  F40.10       Receiving Psychotherapy: Yes  Involved in a men's group through church.  RECOMMENDATIONS:  PDMP reviewed.  Xanax filled 06/25/2021. I provided 25 minutes of non-face-to-face time during this encounter, including time spent before and after the visit in records review, medical decision making, counseling pertinent to today's visit, and charting.  It is hard to say whether he has ADD/ADHD or not.  I recommend he contact [place] attention specialist for evaluation. As far as the anxiety and depression go he is doing well so no changes will be made.  Continue Viibryd 40 mg, 1 p.o. daily.  Continue Xanax 0.5 mg 1 twice daily as needed. Takes it very rarely. Return in 6 months.  Melony Overly, PA-C

## 2022-04-08 ENCOUNTER — Other Ambulatory Visit: Payer: Self-pay | Admitting: Physician Assistant

## 2022-04-25 IMAGING — DX DG CHEST 2V
2 series · 2 of 2 positions shown · non-contrast
Comparison: None.

CLINICAL DATA: Chest pain

EXAM:
CHEST - 2 VIEW

[chest lat]
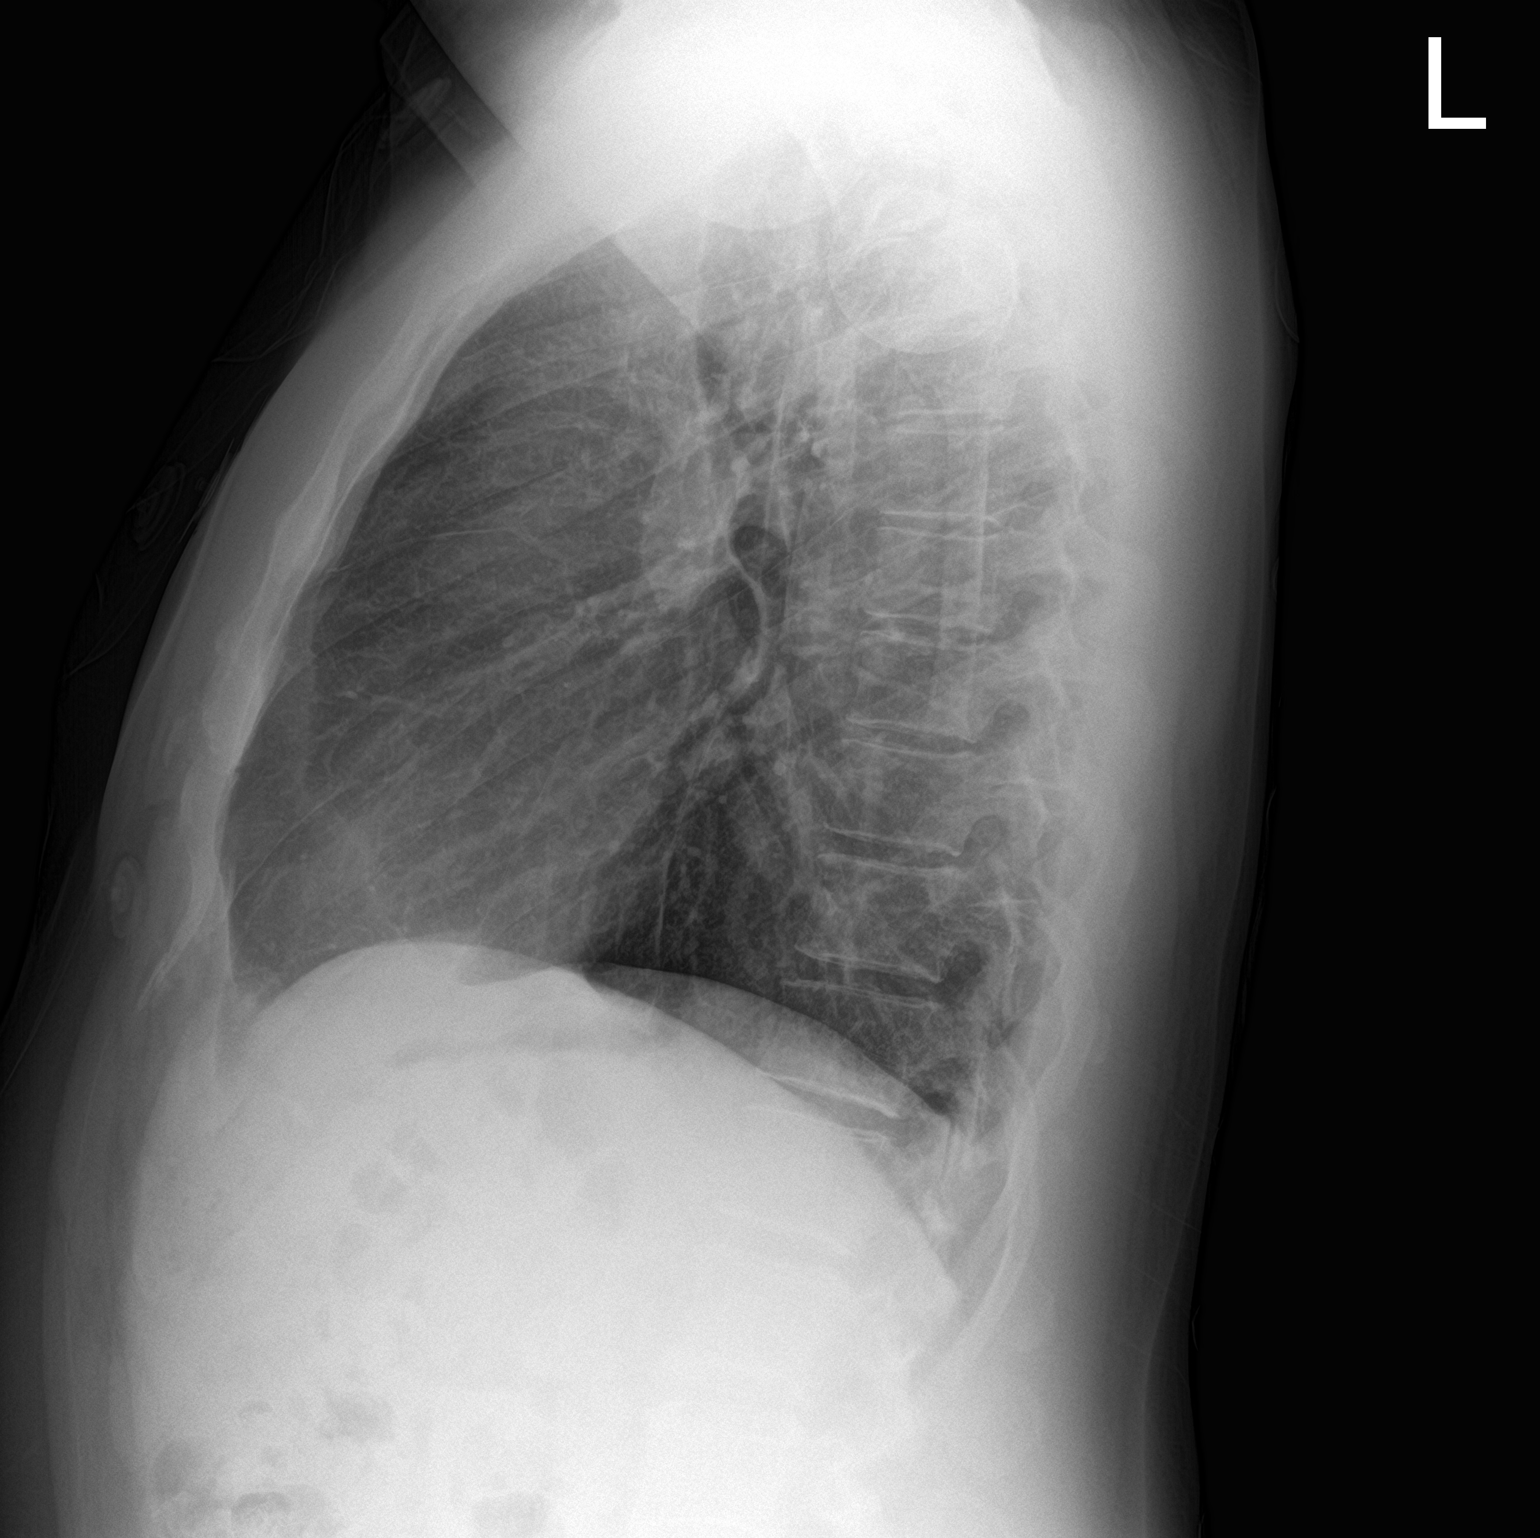

[chest pa]
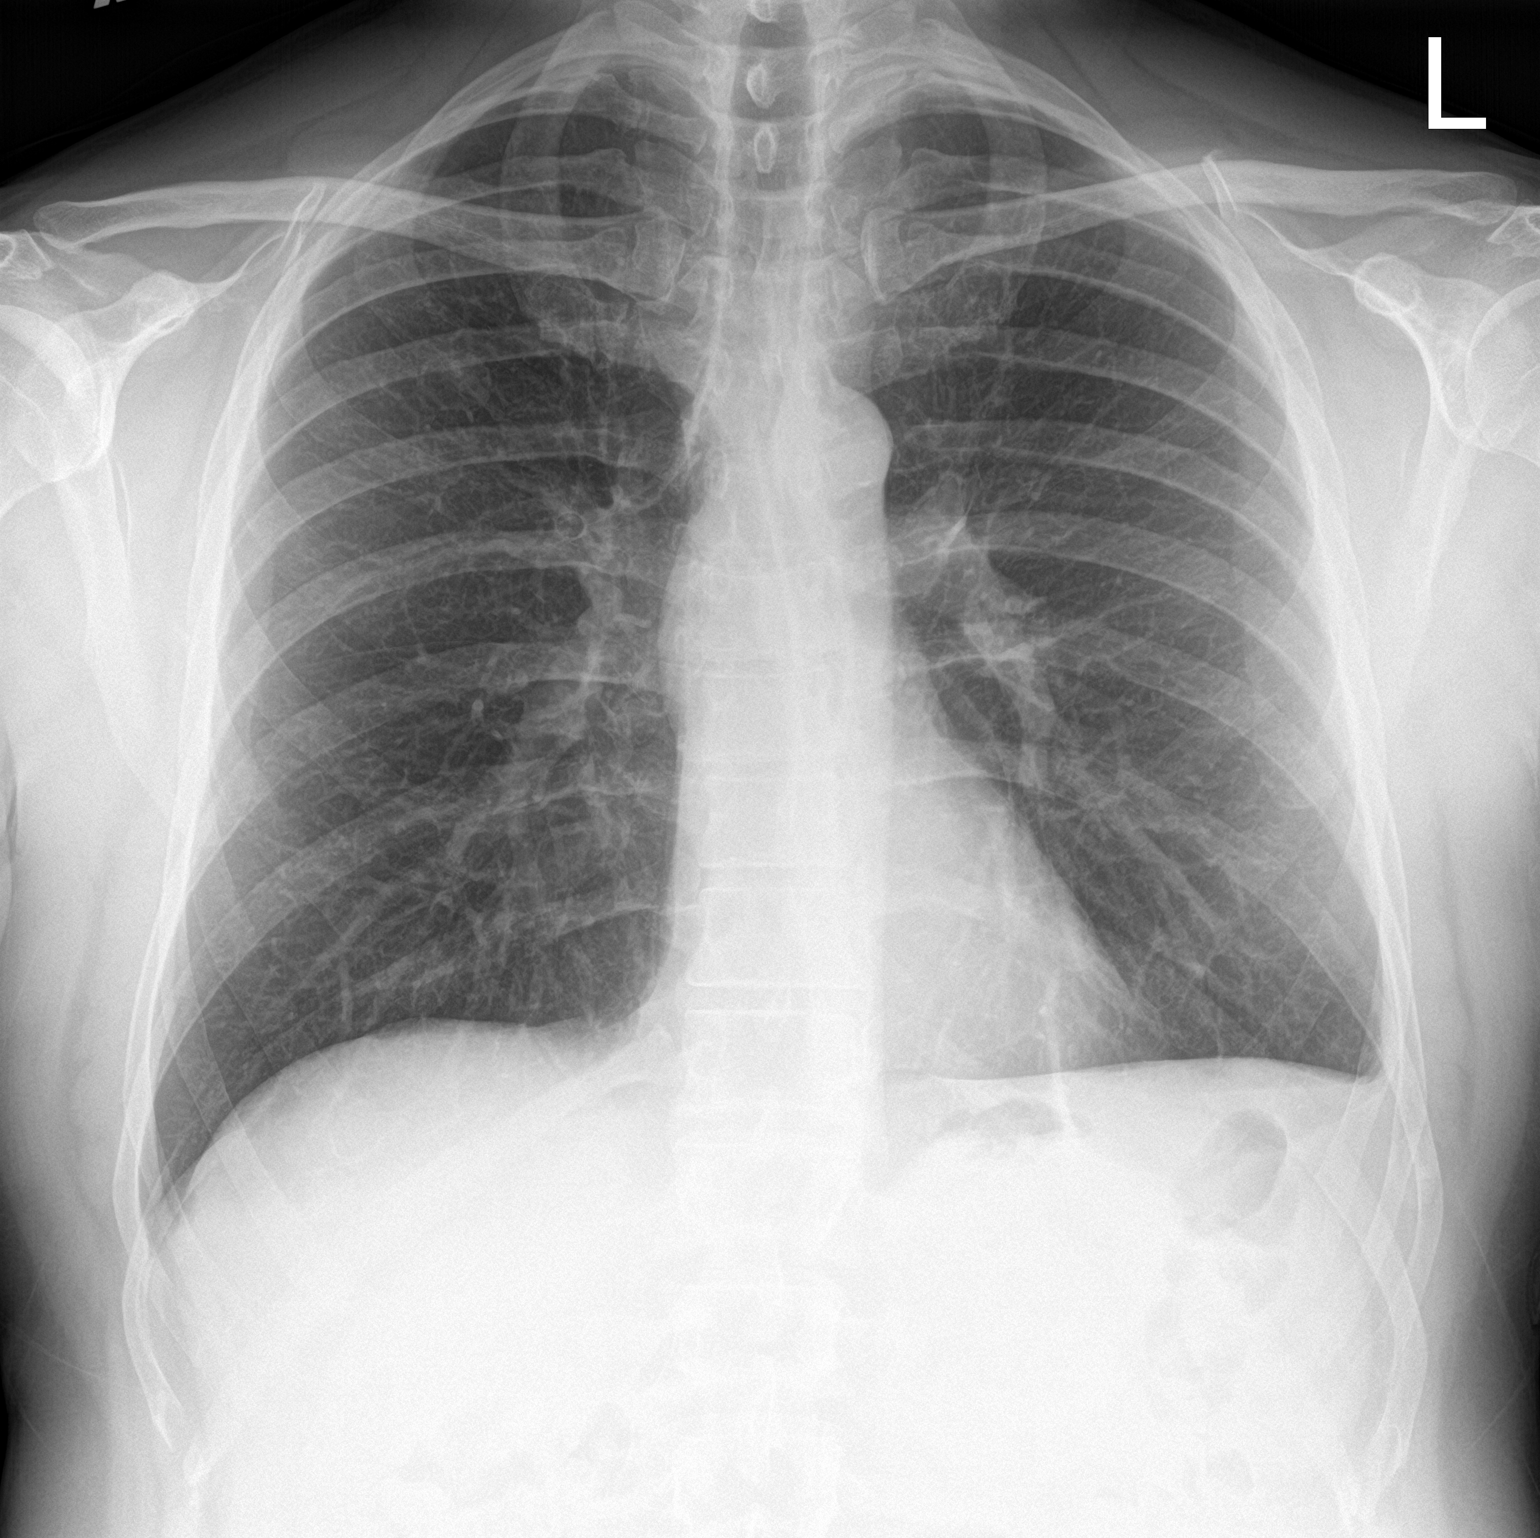

[2 of 2 positions shown; findings below may reference images not displayed]

FINDINGS: The heart size and mediastinal contours are within normal limits.
Both lungs are clear. The visualized skeletal structures are
unremarkable.
IMPRESSION: No active cardiopulmonary disease.

## 2022-07-06 DIAGNOSIS — F4323 Adjustment disorder with mixed anxiety and depressed mood: Secondary | ICD-10-CM | POA: Diagnosis not present

## 2022-07-20 DIAGNOSIS — F4323 Adjustment disorder with mixed anxiety and depressed mood: Secondary | ICD-10-CM | POA: Diagnosis not present

## 2022-07-27 DIAGNOSIS — G4739 Other sleep apnea: Secondary | ICD-10-CM | POA: Diagnosis not present

## 2022-07-27 DIAGNOSIS — Z1211 Encounter for screening for malignant neoplasm of colon: Secondary | ICD-10-CM | POA: Diagnosis not present

## 2022-08-03 DIAGNOSIS — F4323 Adjustment disorder with mixed anxiety and depressed mood: Secondary | ICD-10-CM | POA: Diagnosis not present

## 2022-08-25 DIAGNOSIS — Z0000 Encounter for general adult medical examination without abnormal findings: Secondary | ICD-10-CM | POA: Diagnosis not present

## 2022-08-25 DIAGNOSIS — Z1159 Encounter for screening for other viral diseases: Secondary | ICD-10-CM | POA: Diagnosis not present

## 2022-08-31 ENCOUNTER — Telehealth: Payer: BC Managed Care – PPO | Admitting: Physician Assistant

## 2022-08-31 DIAGNOSIS — F4323 Adjustment disorder with mixed anxiety and depressed mood: Secondary | ICD-10-CM | POA: Diagnosis not present

## 2022-09-01 DIAGNOSIS — G4733 Obstructive sleep apnea (adult) (pediatric): Secondary | ICD-10-CM | POA: Diagnosis not present

## 2022-09-02 DIAGNOSIS — G4733 Obstructive sleep apnea (adult) (pediatric): Secondary | ICD-10-CM | POA: Diagnosis not present

## 2022-09-07 DIAGNOSIS — G4733 Obstructive sleep apnea (adult) (pediatric): Secondary | ICD-10-CM | POA: Diagnosis not present

## 2022-09-07 DIAGNOSIS — M2629 Other anomalies of dental arch relationship: Secondary | ICD-10-CM | POA: Diagnosis not present

## 2022-09-07 DIAGNOSIS — M26213 Malocclusion, Angle's class III: Secondary | ICD-10-CM | POA: Diagnosis not present

## 2022-09-07 DIAGNOSIS — M2602 Maxillary hypoplasia: Secondary | ICD-10-CM | POA: Diagnosis not present

## 2022-09-08 ENCOUNTER — Encounter: Payer: Self-pay | Admitting: Physician Assistant

## 2022-09-08 ENCOUNTER — Telehealth (INDEPENDENT_AMBULATORY_CARE_PROVIDER_SITE_OTHER): Payer: BC Managed Care – PPO | Admitting: Physician Assistant

## 2022-09-08 DIAGNOSIS — F411 Generalized anxiety disorder: Secondary | ICD-10-CM

## 2022-09-08 DIAGNOSIS — F4010 Social phobia, unspecified: Secondary | ICD-10-CM

## 2022-09-08 DIAGNOSIS — F429 Obsessive-compulsive disorder, unspecified: Secondary | ICD-10-CM | POA: Diagnosis not present

## 2022-09-08 MED ORDER — VILAZODONE HCL 40 MG PO TABS: 40.0000 mg | ORAL_TABLET | Freq: Every day | ORAL | 1 refills | Status: DC

## 2022-09-08 MED ORDER — ALPRAZOLAM 0.5 MG PO TABS: 0.5000 mg | ORAL_TABLET | Freq: Two times a day (BID) | ORAL | 1 refills | Status: DC | PRN

## 2022-09-08 NOTE — Progress Notes (Signed)
[facility]  Patient ID: Robert Robbins,  MRN: [id number]  PCP: Gweneth Dimitri, MD  Date of Evaluation: 09/08/2022 time spent:20 minutes  Chief Complaint:  Chief Complaint   Anxiety; Depression; Follow-up    Virtual Visit via Telehealth  I connected with patient by a video enabled telemedicine application with their informed consent, and verified patient privacy and that I am speaking with the correct person using two identifiers.  I am private, in my office and the patient is at work.  I discussed the limitations, risks, security and privacy concerns of performing an evaluation and management service by telephone video and the availability of in person appointments. I also discussed with the patient that there may be a patient responsible charge related to this service. The patient expressed understanding and agreed to proceed.   I discussed the assessment and treatment plan with the patient. The patient was provided an opportunity to ask questions and all were answered. The patient agreed with the plan and demonstrated an understanding of the instructions.   The patient was advised to call back or seek an in-person evaluation if the symptoms worsen or if the condition fails to improve as anticipated.  I provided 20   minutes of non-face-to-face time during this encounter.  HISTORY/CURRENT STATUS: HPI For routine med check.  He is doing well.  Was seen 6 months ago and no change in medication was done at that time.  Over the summer he was working out regularly but he has stopped now due to the cold weather.  He feels that was helping mood some but has not noticed a drop in mood and no symptoms of depression now.  Patient is able to enjoy things.  Energy and motivation are good.  Work is going well.   No extreme sadness, tearfulness, or feelings of hopelessness.  Sleeps well most of the time. ADLs and personal hygiene are normal.   Denies any changes in concentration,  making decisions, or remembering things.  Appetite has not changed.  Weight is stable.  He rarely has anxiety that is severe enough to need a Xanax.  When he does take it, it is for being overwhelmed, not having panic attacks.  It is effective.  No reported obsessions or compulsions.  Denies suicidal or homicidal thoughts.  Patient denies increased energy with decreased need for sleep, increased talkativeness, racing thoughts, impulsivity or risky behaviors, increased spending, increased libido, grandiosity, increased irritability or anger, paranoia, or hallucinations.  Denies dizziness, syncope, seizures, numbness, tingling, tremor, tics, unsteady gait, slurred speech, confusion. Denies muscle or joint pain, stiffness, or dystonia.  Individual Medical History/ Review of Systems: Changes? :No      Past medications for mental health diagnoses include: Propranolol, Lexapro, Prozac, Xanax, Viibryd  Allergies: Patient has no known allergies.  Current Medications:  Current Outpatient Medications:    levocetirizine (XYZAL) 5 MG tablet, Take 5 mg by mouth every evening., Disp: , Rfl:    ALPRAZolam (XANAX) 0.5 MG tablet, Take 1 tablet (0.5 mg total) by mouth 2 (two) times daily as needed for anxiety., Disp: 30 tablet, Rfl: 1   Ascorbic Acid (VITAMIN C) 100 MG tablet, Take 100 mg by mouth daily. (Patient not taking: Reported on 07/24/2020), Disp: , Rfl:    Vilazodone HCl (VIIBRYD) 40 MG TABS, Take 1 tablet (40 mg total) by mouth daily., Disp: 90 tablet, Rfl: 1 Medication Side Effects: none  Family Medical/ Social History: Changes? See HPI  MENTAL HEALTH EXAM:  There were no  vitals taken for this visit.There is no height or weight on file to calculate BMI.  General Appearance: Casual and Well Groomed  Eye Contact:  Good  Speech:  Clear and Coherent and Normal Rate  Volume:  Normal  Mood:  Euthymic  Affect:  Congruent  Thought Process:  Goal Directed and Descriptions of Associations: Intact   Orientation:  Full (Time, Place, and Person)  Thought Content: Logical   Suicidal Thoughts:  No  Homicidal Thoughts:  No  Memory:  WNL  Judgement:  Good  Insight:  Good  Psychomotor Activity:  Normal  Concentration:  Concentration: Good and Attention Span: Fair  Recall:  Good  Fund of Knowledge: Good  Language: Good  Assets:  Desire for Improvement Financial Resources/Insurance Housing Transportation Vocational/Educational  ADL's:  Intact  Cognition: WNL  Prognosis:  Good   DIAGNOSES:    ICD-10-CM   1. Obsessive-compulsive disorder, unspecified type  F42.9     2. Social anxiety disorder  F40.10     3. Generalized anxiety disorder  F41.1       Receiving Psychotherapy: Yes   Eliezer Champagne in [place]  RECOMMENDATIONS:  PDMP reviewed.  Xanax filled 03/02/2022. I provided 20 minutes of non-face-to-face time during this encounter, including time spent before and after the visit in records review, medical decision making, counseling pertinent to today's visit, and charting.   Doing well so no changes needed.   Continue Xanax 0.5 mg 1 twice daily as needed. Takes it very rarely. Continue Viibryd 40 mg, 1 p.o. daily. Continue counseling with Eliezer Champagne Return in 6 months.  Melony Overly, PA-C

## 2022-09-10 DIAGNOSIS — Z1211 Encounter for screening for malignant neoplasm of colon: Secondary | ICD-10-CM | POA: Diagnosis not present

## 2023-03-04 ENCOUNTER — Ambulatory Visit (INDEPENDENT_AMBULATORY_CARE_PROVIDER_SITE_OTHER): Payer: No Typology Code available for payment source | Admitting: Physician Assistant

## 2023-03-04 ENCOUNTER — Encounter: Payer: Self-pay | Admitting: Physician Assistant

## 2023-03-04 DIAGNOSIS — F32A Depression, unspecified: Secondary | ICD-10-CM

## 2023-03-04 DIAGNOSIS — F429 Obsessive-compulsive disorder, unspecified: Secondary | ICD-10-CM

## 2023-03-04 DIAGNOSIS — F4010 Social phobia, unspecified: Secondary | ICD-10-CM

## 2023-03-04 MED ORDER — FLUOXETINE HCL 40 MG PO CAPS: 40.0000 mg | ORAL_CAPSULE | Freq: Every day | ORAL | 1 refills | Status: DC

## 2023-03-04 NOTE — Progress Notes (Signed)
[facility]  Patient ID: Robert Robbins,  MRN: [id number]  PCP: Gweneth Dimitri, MD  Date of Evaluation: 03/04/2023  time spent:20 minutes  Chief Complaint:  Chief Complaint   Anxiety; Follow-up    HISTORY/CURRENT STATUS: HPI For routine med check. His wife on phone in last 1/2 of appt.  More anxious in general, almost like he was before going on meds. Also having to take the Xanax more often b/c public speaking. He used to be able to need it only every few months, but now needs it a couple of times a month. He took Prozac in the past and it helped (from 2019 to fall of 2022 per notes in chart.)  When he does get super anxious he feels nervous on the inside, has a hard time calming down, no palpitations or tachycardia though.  Feels like he cannot relax, gets distracted easily, cannot focus on what he is doing.  He will start to obsess about things like he used to.  His son has ADD and although he has been tested and testing for ADHD was negative, his psychiatrist is treating him for ADD because of his classic symptoms.  Patient's wife states he has classic symptoms as well, cannot stay on task.  Does not complete 1 thing before starting something else.  Has had symptoms of ADD for years, just did not know what they were until he started seeing the manifest in his son.  Patient is able to enjoy things.  Energy and motivation are good.  No extreme sadness, tearfulness, or feelings of hopelessness.  Sleeps well most of the time. ADLs and personal hygiene are normal.   Appetite has not changed.  Weight is stable.  Denies suicidal or homicidal thoughts.  Patient denies increased energy with decreased need for sleep, increased talkativeness, racing thoughts, impulsivity or risky behaviors, increased spending, increased libido, grandiosity, increased irritability or anger, paranoia, or hallucinations.  Denies dizziness, syncope, seizures, numbness, tingling, tremor, tics, unsteady gait,  slurred speech, confusion. Denies muscle or joint pain, stiffness, or dystonia.  Individual Medical History/ Review of Systems: Changes? :Yes    had epidymitis. Treated for it twice.  If it occurs again, his PCP will send him to a urologist.  Wearing braces now, planning on surgery next year to align his jaws.  Past medications for mental health diagnoses include: Propranolol, Lexapro, Prozac, Xanax, Viibryd  Allergies: Patient has no known allergies.  Current Medications:  Current Outpatient Medications:    ALPRAZolam (XANAX) 0.5 MG tablet, Take 1 tablet (0.5 mg total) by mouth 2 (two) times daily as needed for anxiety., Disp: 30 tablet, Rfl: 1   FLUoxetine (PROZAC) 40 MG capsule, Take 1 capsule (40 mg total) by mouth daily., Disp: 30 capsule, Rfl: 1   levocetirizine (XYZAL) 5 MG tablet, Take 5 mg by mouth every evening., Disp: , Rfl:    Ascorbic Acid (VITAMIN C) 100 MG tablet, Take 100 mg by mouth daily. (Patient not taking: Reported on 07/24/2020), Disp: , Rfl:  Medication Side Effects: none  Family Medical/ Social History: Changes? See HPI  MENTAL HEALTH EXAM:  There were no vitals taken for this visit.There is no height or weight on file to calculate BMI.  General Appearance: Casual, Well Groomed, and wearing braces  Eye Contact:  Good  Speech:  Clear and Coherent and Normal Rate  Volume:  Normal  Mood:  Euthymic  Affect:  Congruent  Thought Process:  Goal Directed and Descriptions of Associations: Intact  Orientation:  Full (Time, Place, and Person)  Thought Content: Logical   Suicidal Thoughts:  No  Homicidal Thoughts:  No  Memory:  WNL  Judgement:  Good  Insight:  Good  Psychomotor Activity:  Normal  Concentration:  Concentration: Good and Attention Span: Fair  Recall:  Good  Fund of Knowledge: Good  Language: Good  Assets:  Desire for Improvement Financial Resources/Insurance Housing Resilience Transportation Vocational/Educational  ADL's:  Intact  Cognition:  WNL  Prognosis:  Good   DIAGNOSES:    ICD-10-CM   1. Obsessive-compulsive disorder, unspecified type  F42.9     2. Social anxiety disorder  F40.10     3. Mild depression  F32.A      Receiving Psychotherapy: Yes   Eliezer Champagne in [place]  RECOMMENDATIONS:  PDMP reviewed.  Xanax filled 09/08/2022. I provided 20 minutes of non-face-to-face time during this encounter, including time spent before and after the visit in records review, medical decision making, counseling pertinent to today's visit, and charting.   We discussed to the Viibryd and the fact that it does not seem to be working well anymore.  He responded well to the Prozac for 2 to 3 years so we agreed to switch back to that.  We briefly discussed Luvox but since we know he had no side effects with the Prozac I would recommend that initially.  He can continue the Xanax as usual. He does likely have ADD.  However if at all possible I would like for him to have formal testing either at [facility] or [facility].  If he is not able to get in before our next visit in approximately 6 weeks (I know [facility] may be months however) and depending on how he is responding to the change from Viibryd to Prozac I may go ahead and treat with a stimulant.  We briefly discussed, I do not want to do more than 1 thing at a time and that is why I am not going ahead and starting a stimulant right now.  Wean off Viibryd by taking 20 mg daily for 1 week and then stop. Continue Xanax 0.5 mg 1 twice daily as needed. Takes it pretty rarely. Start Prozac 40 mg, 1 p.o. daily. Continue counseling with Eliezer Champagne Return in 6 weeks.  Melony Overly, PA-C

## 2023-04-07 ENCOUNTER — Telehealth: Payer: No Typology Code available for payment source | Admitting: Physician Assistant

## 2023-04-07 ENCOUNTER — Encounter: Payer: Self-pay | Admitting: Physician Assistant

## 2023-04-07 DIAGNOSIS — F429 Obsessive-compulsive disorder, unspecified: Secondary | ICD-10-CM

## 2023-04-07 DIAGNOSIS — F411 Generalized anxiety disorder: Secondary | ICD-10-CM

## 2023-04-07 DIAGNOSIS — F32A Depression, unspecified: Secondary | ICD-10-CM

## 2023-04-07 MED ORDER — FLUOXETINE HCL 40 MG PO CAPS: 80.0000 mg | ORAL_CAPSULE | Freq: Every day | ORAL | 0 refills | Status: DC

## 2023-04-07 MED ORDER — ALPRAZOLAM 0.5 MG PO TABS: 0.5000 mg | ORAL_TABLET | Freq: Two times a day (BID) | ORAL | 1 refills | Status: DC | PRN

## 2023-04-07 NOTE — Progress Notes (Signed)
[facility]  Patient ID: Robert Robbins,  MRN: [id number]  PCP: Gweneth Dimitri, MD  Date of Evaluation: 04/07/2023  time spent:20 minutes  Chief Complaint:  Chief Complaint   Anxiety; Depression   Virtual Visit via Telehealth  I connected with patient by a video enabled telemedicine application with their informed consent, and verified patient privacy and that I am speaking with the correct person using two identifiers.  I am private, in my office and the patient is at work.  I discussed the limitations, risks, security and privacy concerns of performing an evaluation and management service by video and the availability of in person appointments. I also discussed with the patient that there may be a patient responsible charge related to this service. The patient expressed understanding and agreed to proceed.   I discussed the assessment and treatment plan with the patient. The patient was provided an opportunity to ask questions and all were answered. The patient agreed with the plan and demonstrated an understanding of the instructions.   The patient was advised to call back or seek an in-person evaluation if the symptoms worsen or if the condition fails to improve as anticipated.  I provided 20 minutes of non-face-to-face time during this encounter.  HISTORY/CURRENT STATUS: HPI  For f/u after restarting Prozac  He didn't feel a lot of benefit from the 40 mg of Prozac so he decided to double it a few days ago.  He has been on as much as 80 mg in the past and it was more effective.  He already feels better, not as overwhelmed all the time.  He has needed the Xanax more than he had been, has taken 1 pill several days last week and it was effective.  Not having panic attacks, just gets overwhelmed.  He has been on hydroxyzine in the past, he thinks, and asks whether that would be an option.  Also asks about BuSpar.  He has never taken it but his wife does and it really helps  her.  Patient is able to enjoy things.  Energy and motivation are good.  Work is going well.   No extreme sadness, tearfulness, or feelings of hopelessness.  Sleeps well most of the time. ADLs and personal hygiene are normal.  Appetite has not changed.  Weight is stable.  Denies suicidal or homicidal thoughts.  He has not been able to get in for ADHD testing.  He still has trouble staying on task.  At some point when it is appropriate he would like to address that.  Does not feel like now is the time, needs to have the anxiety better controlled first.  Patient denies increased energy with decreased need for sleep, increased talkativeness, racing thoughts, impulsivity or risky behaviors, increased spending, increased libido, grandiosity, increased irritability or anger, paranoia, or hallucinations.  Denies dizziness, syncope, seizures, numbness, tingling, tremor, tics, unsteady gait, slurred speech, confusion. Denies muscle or joint pain, stiffness, or dystonia.  Individual Medical History/ Review of Systems: Changes? :No      Past medications for mental health diagnoses include: Propranolol, Lexapro, Prozac, Xanax, Viibryd, hydroxyzine  Allergies: Patient has no known allergies.  Current Medications:  Current Outpatient Medications:    levocetirizine (XYZAL) 5 MG tablet, Take 5 mg by mouth every evening., Disp: , Rfl:    ALPRAZolam (XANAX) 0.5 MG tablet, Take 1 tablet (0.5 mg total) by mouth 2 (two) times daily as needed for anxiety., Disp: 30 tablet, Rfl: 1   Ascorbic Acid (VITAMIN C)  100 MG tablet, Take 100 mg by mouth daily. (Patient not taking: Reported on 07/24/2020), Disp: , Rfl:    FLUoxetine (PROZAC) 40 MG capsule, Take 2 capsules (80 mg total) by mouth daily., Disp: 180 capsule, Rfl: 0 Medication Side Effects: none  Family Medical/ Social History: Changes? See HPI  MENTAL HEALTH EXAM:  There were no vitals taken for this visit.There is no height or weight on file to calculate  BMI.  General Appearance: Casual, Well Groomed, and wearing braces  Eye Contact:  Good  Speech:  Clear and Coherent and Normal Rate  Volume:  Normal  Mood:  Euthymic  Affect:  Congruent  Thought Process:  Goal Directed and Descriptions of Associations: Intact  Orientation:  Full (Time, Place, and Person)  Thought Content: Logical   Suicidal Thoughts:  No  Homicidal Thoughts:  No  Memory:  WNL  Judgement:  Good  Insight:  Good  Psychomotor Activity:  Normal  Concentration:  Concentration: Good and Attention Span: Fair  Recall:  Good  Fund of Knowledge: Good  Language: Good  Assets:  Communication Skills Desire for Improvement Financial Resources/Insurance Housing Resilience Transportation Vocational/Educational  ADL's:  Intact  Cognition: WNL  Prognosis:  Good   DIAGNOSES:    ICD-10-CM   1. Obsessive-compulsive disorder, unspecified type  F42.9     2. Mild depression  F32.A     3. Generalized anxiety disorder  F41.1      Receiving Psychotherapy: Yes   Eliezer Champagne in [place]  RECOMMENDATIONS:  PDMP reviewed.  Xanax filled 09/08/2022. I provided 20 minutes of non-face-to-face time during this encounter, including time spent before and after the visit in records review, medical decision making, counseling pertinent to today's visit, and charting.   We briefly discussed Buspar and hydroxyzine.  They are both something we can prescribe in the future if needed.  He and I agreed to make no other changes at this time. We can also address the ADD symptoms once the anxiety is under control.  Continue Xanax 0.5 mg, 1 po bid prn.  Continue Prozac 80 mg.  Continue counseling with Eliezer Champagne Return in 6 weeks.  Melony Overly, PA-C

## 2023-04-21 ENCOUNTER — Other Ambulatory Visit: Payer: Self-pay | Admitting: Physician Assistant

## 2023-05-12 ENCOUNTER — Encounter: Payer: Self-pay | Admitting: Physician Assistant

## 2023-05-12 ENCOUNTER — Telehealth (INDEPENDENT_AMBULATORY_CARE_PROVIDER_SITE_OTHER): Payer: No Typology Code available for payment source | Admitting: Physician Assistant

## 2023-05-12 DIAGNOSIS — F411 Generalized anxiety disorder: Secondary | ICD-10-CM

## 2023-05-12 DIAGNOSIS — F429 Obsessive-compulsive disorder, unspecified: Secondary | ICD-10-CM

## 2023-05-12 MED ORDER — BUSPIRONE HCL 15 MG PO TABS: ORAL_TABLET | ORAL | 1 refills | Status: DC

## 2023-05-12 NOTE — Progress Notes (Signed)
[facility]  Patient ID: Robert Robbins,  MRN: [id number]  PCP: Gweneth Dimitri, MD  Date of Evaluation: 04/07/2023  time spent:20 minutes  Chief Complaint:  Chief Complaint   Anxiety; Follow-up    Virtual Visit via Telehealth  I connected with patient by a video enabled telemedicine application with their informed consent, and verified patient privacy and that I am speaking with the correct person using two identifiers.  I am private, in my office and the patient is at work.  I discussed the limitations, risks, security and privacy concerns of performing an evaluation and management service by video and the availability of in person appointments. I also discussed with the patient that there may be a patient responsible charge related to this service. The patient expressed understanding and agreed to proceed.   I discussed the assessment and treatment plan with the patient. The patient was provided an opportunity to ask questions and all were answered. The patient agreed with the plan and demonstrated an understanding of the instructions.   The patient was advised to call back or seek an in-person evaluation if the symptoms worsen or if the condition fails to improve as anticipated.  I provided 20 minutes of non-face-to-face time during this encounter.  HISTORY/CURRENT STATUS: HPI  For f/u after increasing Prozac  He's been on the max dose of Prozac for a little over a month. It's not helping the anxiety at all. Still anxious all the time, and often seems on the verge of PA. The anxiety is an everyday thing, whether he's at work and stressed or at home not doing anything. Recently he was on vacation, driving in [place] on a [place] bridge and almost had a PA, felt like he was going to faint. Xanax helps but he doesn't want to take often. "I need it every day but I only take it a few times a week."  He is not obsessing until he gets something on his mind then he will go down  the rabbit hole thinking about something whether good or bad.  Wife has told him his attitude is better on the Prozac than the Viibryd.  Patient is able to enjoy things.  Energy and motivation are good.  Work is going well but stressful.   No extreme sadness, tearfulness, or feelings of hopelessness.  Sleeps well most of the time.  ADLs and personal hygiene are normal.   Denies any changes in concentration, making decisions, or remembering things.  Appetite has not changed.  Weight is stable.   Denies suicidal or homicidal thoughts.  Patient denies increased energy with decreased need for sleep, increased talkativeness, racing thoughts, impulsivity or risky behaviors, increased spending, increased libido, grandiosity, increased irritability or anger, paranoia, or hallucinations.  Denies dizziness, syncope, seizures, numbness, tingling, tremor, tics, unsteady gait, slurred speech, confusion. Denies muscle or joint pain, stiffness, or dystonia.  Individual Medical History/ Review of Systems: Changes? :No      Past medications for mental health diagnoses include: Propranolol, Lexapro, Prozac, Xanax, Viibryd, hydroxyzine  Allergies: Patient has no known allergies.  Current Medications:  Current Outpatient Medications:    ALPRAZolam (XANAX) 0.5 MG tablet, Take 1 tablet (0.5 mg total) by mouth 2 (two) times daily as needed for anxiety., Disp: 30 tablet, Rfl: 1   busPIRone (BUSPAR) 15 MG tablet, 1/3 tablet twice daily for 1 week, then increase to 2/3 tablet twice daily for 1 week, then increase to 1 tablet twice daily for anxiety., Disp: 60 tablet, Rfl:  1   FLUoxetine (PROZAC) 40 MG capsule, Take 2 capsules (80 mg total) by mouth daily., Disp: 180 capsule, Rfl: 0   levocetirizine (XYZAL) 5 MG tablet, Take 5 mg by mouth every evening., Disp: , Rfl:    Ascorbic Acid (VITAMIN C) 100 MG tablet, Take 100 mg by mouth daily. (Patient not taking: Reported on 07/24/2020), Disp: , Rfl:  Medication Side Effects:  none  Family Medical/ Social History: Changes? See HPI  MENTAL HEALTH EXAM:  There were no vitals taken for this visit.There is no height or weight on file to calculate BMI.  General Appearance: Casual, Well Groomed, and wearing braces  Eye Contact:  Good  Speech:  Clear and Coherent and Normal Rate  Volume:  Normal  Mood:  Anxious  Affect:  Congruent  Thought Process:  Goal Directed and Descriptions of Associations: Intact  Orientation:  Full (Time, Place, and Person)  Thought Content: Logical   Suicidal Thoughts:  No  Homicidal Thoughts:  No  Memory:  WNL  Judgement:  Good  Insight:  Good  Psychomotor Activity:  Normal  Concentration:  Concentration: Good and Attention Span: Good  Recall:  Good  Fund of Knowledge: Good  Language: Good  Assets:  Communication Skills Desire for Improvement Financial Resources/Insurance Housing Resilience Transportation Vocational/Educational  ADL's:  Intact  Cognition: WNL  Prognosis:  Good   DIAGNOSES:    ICD-10-CM   1. Generalized anxiety disorder  F41.1     2. Obsessive-compulsive disorder, unspecified type  F42.9      Receiving Psychotherapy: Yes   Eliezer Champagne in [place]  RECOMMENDATIONS:  PDMP reviewed.  Xanax filled 09/08/2022. I provided 20 minutes of non-face-to-face time during this encounter, including time spent before and after the visit in records review, medical decision making, counseling pertinent to today's visit, and charting.   We discussed different options, including adding BuSpar which would help prevent the anxiety, and/or propranolol which can treat and sometimes prevent anxiety.  Pros and cons of each was discussed and we agreed on the BuSpar.  Continue Xanax 0.5 mg, 1 po bid prn.  Start BuSpar 15 mg 1/3 tablet twice daily for 1 week, then increase to 2/3 tablet twice daily for 1 week, then increase to 1 tablet twice daily for anxiety. Continue Prozac 80 mg.  Continue counseling with Eliezer Champagne Return  in 6 weeks.  Melony Overly, PA-C

## 2023-07-06 ENCOUNTER — Ambulatory Visit (INDEPENDENT_AMBULATORY_CARE_PROVIDER_SITE_OTHER): Payer: Self-pay | Admitting: Physician Assistant

## 2023-07-06 DIAGNOSIS — Z91199 Patient's noncompliance with other medical treatment and regimen due to unspecified reason: Secondary | ICD-10-CM

## 2023-07-06 NOTE — Progress Notes (Signed)
No show

## 2023-07-15 ENCOUNTER — Telehealth: Payer: No Typology Code available for payment source | Admitting: Physician Assistant

## 2023-07-15 ENCOUNTER — Encounter: Payer: Self-pay | Admitting: Physician Assistant

## 2023-07-15 DIAGNOSIS — F411 Generalized anxiety disorder: Secondary | ICD-10-CM | POA: Diagnosis not present

## 2023-07-15 DIAGNOSIS — F4010 Social phobia, unspecified: Secondary | ICD-10-CM | POA: Diagnosis not present

## 2023-07-15 DIAGNOSIS — F429 Obsessive-compulsive disorder, unspecified: Secondary | ICD-10-CM | POA: Diagnosis not present

## 2023-07-15 DIAGNOSIS — F902 Attention-deficit hyperactivity disorder, combined type: Secondary | ICD-10-CM

## 2023-07-15 MED ORDER — BUSPIRONE HCL 15 MG PO TABS: 15.0000 mg | ORAL_TABLET | Freq: Two times a day (BID) | ORAL | 1 refills | Status: DC

## 2023-07-15 MED ORDER — FLUOXETINE HCL 40 MG PO CAPS: 80.0000 mg | ORAL_CAPSULE | Freq: Every day | ORAL | 1 refills | Status: DC

## 2023-07-15 MED ORDER — METHYLPHENIDATE HCL ER (LA) 10 MG PO CP24: 10.0000 mg | ORAL_CAPSULE | Freq: Every day | ORAL | 0 refills | Status: DC

## 2023-07-15 NOTE — Progress Notes (Signed)
[facility]  Patient ID: Robert Robbins,  MRN: [id number]  PCP: Gweneth Dimitri, MD  Date of Evaluation: 07/15/2023  time spent: 22 minutes  Chief Complaint:  Chief Complaint   Anxiety; Depression; Follow-up    Virtual Visit via Telehealth  I connected with patient by a video enabled telemedicine application with their informed consent, and verified patient privacy and that I am speaking with the correct person using two identifiers.  I am private, in my office and the patient is at work.  I discussed the limitations, risks, security and privacy concerns of performing an evaluation and management service by video and the availability of in person appointments. I also discussed with the patient that there may be a patient responsible charge related to this service. The patient expressed understanding and agreed to proceed.   I discussed the assessment and treatment plan with the patient. The patient was provided an opportunity to ask questions and all were answered. The patient agreed with the plan and demonstrated an understanding of the instructions.   The patient was advised to call back or seek an in-person evaluation if the symptoms worsen or if the condition fails to improve as anticipated.  I provided 22 minutes of non-face-to-face time during this encounter.  HISTORY/CURRENT STATUS: HPI  For f/u after starting Buspar.  His wife, Shanda Bumps, was with him.  Robert Robbins states he is doing well as far as the anxiety goes.  He feels like the BuSpar has helped, he is not having social anxiety as much as he did.  He takes the Xanax maybe a couple of times a week, mostly for social anxiety at work.  Sometimes he does need it in the evening so he can go to sleep.  But that is only a couple of times a week.  He does have racing thoughts.  He has always been like this.  Has trouble getting his mind to turn off.  Not having panic attacks.  He does ruminate about things.  His wife states he  has always been like that and one of their kids is as well.    Both children have ADHD.  One had the testing done but knew how to "trick the system.  So it did not show he has ADHD.  He sees Dr. Tonny Bollman who has diagnosed him with ADHD and a stimulant is helping."  Patient feels like he has ADHD as well.  He has never gone through the testing because of the situation with his son.  It is expensive and he does not want to go through it especially since it could be wrong.  Patient is able to enjoy things.  Energy and motivation are good.  Work is going well.   No extreme sadness, tearfulness, or feelings of hopelessness.  Sleeps well most of the time. ADLs and personal hygiene are normal.  Appetite has not changed.  Weight is stable.   Denies suicidal or homicidal thoughts.  Patient denies increased energy with decreased need for sleep, increased talkativeness, racing thoughts, impulsivity or risky behaviors, increased spending, increased libido, grandiosity, increased irritability or anger, paranoia, or hallucinations.  Denies dizziness, syncope, seizures, numbness, tingling, tremor, tics, unsteady gait, slurred speech, confusion. Denies muscle or joint pain, stiffness, or dystonia.  Individual Medical History/ Review of Systems: Changes? :No      Past medications for mental health diagnoses include: Propranolol, Lexapro, Prozac, Xanax, Viibryd, hydroxyzine  Allergies: Patient has no known allergies.  Current Medications:  Current Outpatient Medications:  ALPRAZolam (XANAX) 0.5 MG tablet, Take 1 tablet (0.5 mg total) by mouth 2 (two) times daily as needed for anxiety., Disp: 30 tablet, Rfl: 1   levocetirizine (XYZAL) 5 MG tablet, Take 5 mg by mouth every evening., Disp: , Rfl:    methylphenidate (RITALIN LA) 10 MG 24 hr capsule, Take 1 capsule (10 mg total) by mouth daily., Disp: 30 capsule, Rfl: 0   Ascorbic Acid (VITAMIN C) 100 MG tablet, Take 100 mg by mouth daily. (Patient not taking:  Reported on 07/24/2020), Disp: , Rfl:    busPIRone (BUSPAR) 15 MG tablet, Take 1 tablet (15 mg total) by mouth 2 (two) times daily., Disp: 180 tablet, Rfl: 1   FLUoxetine (PROZAC) 40 MG capsule, Take 2 capsules (80 mg total) by mouth daily., Disp: 180 capsule, Rfl: 1 Medication Side Effects: none  Family Medical/ Social History: Changes?  none  MENTAL HEALTH EXAM:  There were no vitals taken for this visit.There is no height or weight on file to calculate BMI.  General Appearance: Casual and Well Groomed  Eye Contact:  Good  Speech:  Clear and Coherent and Normal Rate  Volume:  Normal  Mood:  Euthymic  Affect:  Congruent  Thought Process:  Goal Directed and Descriptions of Associations: Intact  Orientation:  Full (Time, Place, and Person)  Thought Content: Logical   Suicidal Thoughts:  No  Homicidal Thoughts:  No  Memory:  WNL  Judgement:  Good  Insight:  Good  Psychomotor Activity:  Normal  Concentration:  Concentration: Good and Attention Span: Fair  Recall:  Good  Fund of Knowledge: Good  Language: Good  Assets:  Communication Skills Desire for Improvement Financial Resources/Insurance Housing Resilience Transportation Vocational/Educational  ADL's:  Intact  Cognition: WNL  Prognosis:  Good   DIAGNOSES:    ICD-10-CM   1. Attention deficit hyperactivity disorder (ADHD), combined type  F90.2     2. Obsessive-compulsive disorder, unspecified type  F42.9     3. Generalized anxiety disorder  F41.1     4. Social anxiety disorder  F40.10      Receiving Psychotherapy: Yes   Elie[facility] Champagne in [place]  RECOMMENDATIONS:  PDMP reviewed.  Xanax filled 06/17/2023. I provided 22 minutes of non-face to face time during this encounter, including time spent before and after the visit in records review, medical decision making, counseling pertinent to today's visit, and charting.   We discussed the possibility of ADHD.  He and I have discussed it in the past and I felt like treating  the anxiety was more important at the time.  That is much better treated now, I think it is time we treat the ADHD.  Formal testing could be done however regardless of the results he still has the symptoms.  I recommend treating with a stimulant and if that is not effective then I may strongly recommend having formal testing done.  We discussed nonstimulant and stimulant medications.  Pros and cons of each were discussed.  I think he would respond better to a stimulant.  He understands that Xanax is a downer, a stimulant is an upper so it is not good to take them together.  For him to take the Xanax a couple of times a week is fine, just not daily and at the same time as he takes the stimulant.  Discussed potential benefits, risks, and side effects of stimulants with patient to include increased heart rate, palpitations, insomnia, increased anxiety, increased irritability, or decreased appetite.  The patient  understands and accepts these risks.  Instructed patient to contact office if experiencing any significant tolerability issues.  Continue Xanax 0.5 mg, 1 po bid prn.  Continue BuSpar 15 mg, 1 po bid.  Continue Prozac 80 mg. Start Ritalin LA 10 mg, 1 p.o. every morning. Continue counseling with Eliezer Champagne Return in 4 weeks.  Melony Overly, PA-C

## 2023-08-10 ENCOUNTER — Telehealth: Payer: No Typology Code available for payment source | Admitting: Physician Assistant

## 2023-08-10 ENCOUNTER — Encounter: Payer: Self-pay | Admitting: Physician Assistant

## 2023-08-10 DIAGNOSIS — F411 Generalized anxiety disorder: Secondary | ICD-10-CM | POA: Diagnosis not present

## 2023-08-10 DIAGNOSIS — F429 Obsessive-compulsive disorder, unspecified: Secondary | ICD-10-CM | POA: Diagnosis not present

## 2023-08-10 DIAGNOSIS — F902 Attention-deficit hyperactivity disorder, combined type: Secondary | ICD-10-CM

## 2023-08-10 DIAGNOSIS — F4010 Social phobia, unspecified: Secondary | ICD-10-CM

## 2023-08-10 MED ORDER — METHYLPHENIDATE HCL ER (LA) 20 MG PO CP24: 20.0000 mg | ORAL_CAPSULE | ORAL | 0 refills | Status: DC

## 2023-08-10 NOTE — Progress Notes (Signed)
[facility]  Patient ID: Robert Robbins,  MRN: [id number]  PCP: Gweneth Dimitri, MD  Date of Evaluation: 08/10/2023  time spent:20 minutes  Chief Complaint:  Chief Complaint   Depression; Anxiety; ADHD    Virtual Visit via Telehealth  I connected with patient by a video enabled telemedicine application with their informed consent, and verified patient privacy and that I am speaking with the correct person using two identifiers.  I am private, in my office and the patient is at work.  I discussed the limitations, risks, security and privacy concerns of performing an evaluation and management service by video and the availability of in person appointments. I also discussed with the patient that there may be a patient responsible charge related to this service. The patient expressed understanding and agreed to proceed.   I discussed the assessment and treatment plan with the patient. The patient was provided an opportunity to ask questions and all were answered. The patient agreed with the plan and demonstrated an understanding of the instructions.   The patient was advised to call back or seek an in-person evaluation if the symptoms worsen or if the condition fails to improve as anticipated.  I provided 20 minutes of non-face-to-face time during this encounter.  HISTORY/CURRENT STATUS: HPI  For f/u after starting Ritalin.  The Ritalin has helped A LOT! He's needing the Xanax much less often b/c he's able to focus on work and that has decreased his anxiety.  There have been a handful of times that he has had to take the Xanax in the past 3 to 4 weeks.  Not obsessing about things.  He has not had any side effects from the Ritalin, except he has been sweating more at night for the past 2 weeks.  He is not sure if that is related or not.  It did not start until after he been on the Ritalin for a week or so.  He has lost a few pounds since going on it.    Patient is able to enjoy  things.  Energy and motivation are good.  Work is going well.   No extreme sadness, tearfulness, or feelings of hopelessness.  Sleeps well.  ADLs and personal hygiene are normal.   Appetite is decreased since being on the Ritalin.  Denies suicidal or homicidal thoughts.  Patient denies increased energy with decreased need for sleep, increased talkativeness, racing thoughts, impulsivity or risky behaviors, increased spending, increased libido, grandiosity, increased irritability or anger, paranoia, or hallucinations.  Review of Systems  Constitutional:  Positive for diaphoresis.       See HPI  HENT: Negative.    Eyes: Negative.   Respiratory: Negative.    Cardiovascular: Negative.   Gastrointestinal: Negative.   Genitourinary: Negative.   Musculoskeletal: Negative.   Skin: Negative.   Neurological: Negative.   Endo/Heme/Allergies: Negative.   Psychiatric/Behavioral:         See HPI   Individual Medical History/ Review of Systems: Changes? :No      Past medications for mental health diagnoses include: Propranolol, Lexapro, Prozac, Xanax, Viibryd, hydroxyzine  Allergies: Patient has no known allergies.  Current Medications:  Current Outpatient Medications:    ALPRAZolam (XANAX) 0.5 MG tablet, Take 1 tablet (0.5 mg total) by mouth 2 (two) times daily as needed for anxiety., Disp: 30 tablet, Rfl: 1   busPIRone (BUSPAR) 15 MG tablet, Take 1 tablet (15 mg total) by mouth 2 (two) times daily., Disp: 180 tablet, Rfl: 1   FLUoxetine (  PROZAC) 40 MG capsule, Take 2 capsules (80 mg total) by mouth daily., Disp: 180 capsule, Rfl: 1   levocetirizine (XYZAL) 5 MG tablet, Take 5 mg by mouth every evening., Disp: , Rfl:    methylphenidate (RITALIN LA) 20 MG 24 hr capsule, Take 1 capsule (20 mg total) by mouth every morning., Disp: 30 capsule, Rfl: 0   Ascorbic Acid (VITAMIN C) 100 MG tablet, Take 100 mg by mouth daily. (Patient not taking: Reported on 07/24/2020), Disp: , Rfl:  Medication Side  Effects: none  Family Medical/ Social History: Changes?  none  MENTAL HEALTH EXAM:  There were no vitals taken for this visit.There is no height or weight on file to calculate BMI.  General Appearance: Casual and Well Groomed  Eye Contact:  Good  Speech:  Clear and Coherent and Normal Rate  Volume:  Normal  Mood:  Euthymic  Affect:  Congruent  Thought Process:  Goal Directed and Descriptions of Associations: Intact  Orientation:  Full (Time, Place, and Person)  Thought Content: Logical   Suicidal Thoughts:  No  Homicidal Thoughts:  No  Memory:  WNL  Judgement:  Good  Insight:  Good  Psychomotor Activity:  Normal  Concentration:  Concentration: Good and Attention Span: Fair  Recall:  Good  Fund of Knowledge: Good  Language: Good  Assets:  Desire for Improvement Financial Resources/Insurance Housing Resilience Transportation Vocational/Educational  ADL's:  Intact  Cognition: WNL  Prognosis:  Good   DIAGNOSES:    ICD-10-CM   1. Attention deficit hyperactivity disorder (ADHD), combined type  F90.2     2. Generalized anxiety disorder  F41.1     3. Social anxiety disorder  F40.10     4. Obsessive-compulsive disorder, unspecified type  F42.9       Receiving Psychotherapy: Yes   Eliezer Champagne in [place]  RECOMMENDATIONS:  PDMP reviewed.  Xanax filled 06/17/2023. I provided 20 minutes of non-face to face time during this encounter, including time spent before and after the visit in records review, medical decision making, counseling pertinent to today's visit, and charting.   I am glad to hear that he is responding to the Ritalin.  I think increasing the dose would help even more though.  We discussed the night sweats.  I am not sure it is related to the Ritalin since it started a week or so after the Ritalin was started.  It is possible it could be due to serotonin, if it continues to happen for another week or 2 then call and I would decrease the Prozac to 60 mg to see if  that will help.  He knows to watch for fevers or any signs of infection and to contact his primary provider if that is the case.  He does report minimal weight loss but that could be from the stimulant plus the fact that he has been working outside each weekend day building a shed, it has been hot and he sweats some then too.  We will continue to monitor.  He will call in approximately 4 weeks to let me know his response to the higher dose of Ritalin.  If it is working well I will go ahead and send in enough to last until the next appointment.  If not I will increase and he will need to call again in a month after that to report progress.  He prefers not to be seeing in the next month or so because of the holidays, busyness which is fine.  Continue Xanax 0.5 mg, 1 po bid prn.  Take sparingly. Continue BuSpar 15 mg, 1 po bid.  Continue Prozac 80 mg. Increase Ritalin LA to 20 mg, 1 p.o. every morning. Continue counseling with Eliezer Champagne Return in 2 to 3 months.  Melony Overly, PA-C

## 2023-09-06 ENCOUNTER — Other Ambulatory Visit (HOSPITAL_BASED_OUTPATIENT_CLINIC_OR_DEPARTMENT_OTHER): Payer: Self-pay | Admitting: Family Medicine

## 2023-09-06 DIAGNOSIS — E782 Mixed hyperlipidemia: Secondary | ICD-10-CM

## 2023-09-06 DIAGNOSIS — R748 Abnormal levels of other serum enzymes: Secondary | ICD-10-CM

## 2023-09-08 ENCOUNTER — Telehealth (HOSPITAL_BASED_OUTPATIENT_CLINIC_OR_DEPARTMENT_OTHER): Payer: Self-pay | Admitting: Family Medicine

## 2023-09-09 ENCOUNTER — Other Ambulatory Visit: Payer: Self-pay | Admitting: Physician Assistant

## 2023-09-09 ENCOUNTER — Telehealth: Payer: Self-pay | Admitting: Physician Assistant

## 2023-09-09 MED ORDER — METHYLPHENIDATE HCL ER (LA) 30 MG PO CP24: 30.0000 mg | ORAL_CAPSULE | ORAL | 0 refills | Status: DC

## 2023-09-09 NOTE — Telephone Encounter (Signed)
I called pt for follow up appt. He said he would like a refill of Methylphenidate to [facility] [place].  He said he would take an incremental increase of the medication if TH is ok with it and be able to discuss the change when he has his visit but he's ok if you don't want to do it until his visit also.  Next appt 2/21

## 2023-09-09 NOTE — Telephone Encounter (Signed)
Pt.notified

## 2023-09-09 NOTE — Telephone Encounter (Signed)
Please let him know I sent in a prescription for the next higher dose, Ritalin LA 30 mg.  After he has been on this dose for 3 weeks or so, call to let me know how he is responding.  If it is working well I will send in another 30-day supply to get him to our appointment in February.  If not, I will increase the dose even further.

## 2023-09-09 NOTE — Telephone Encounter (Signed)
Robert Robbins reports that he just started at Ritalin 10 mg 10/24 for a month, he noticed a big difference; then increased to Ritalin 20 mg 11/19. He said the difference he saw from 10 mg to 20 mg was not as great as it was from 0 to 10 mg. He said that he is still struggling and would like to try an increase the dose.   Please advise.

## 2023-09-21 ENCOUNTER — Telehealth: Payer: Self-pay | Admitting: Physician Assistant

## 2023-09-21 NOTE — Telephone Encounter (Signed)
 He'll have to bring the remainder of the Ritalin to [place] so it can be properly disposed of. Then I'll send in the new Rx.

## 2023-09-21 NOTE — Telephone Encounter (Signed)
Spoke to pt, he plans on taking remainder of ritalin 30 mg to pharmacy to be returned of and disposed of properly. He will call when has done so.

## 2023-09-21 NOTE — Telephone Encounter (Signed)
 PT called and said that the 30 mg ritalin  was to strong. He said he only took it for 2 days and it made him have more panic. He would like to go back down to 10 mg ritalin . He said he felt the best on that dose. Please put a note on script that it is a lower dose so pharmacy will fill it. Pharmacy is [facility] [address]

## 2023-09-23 ENCOUNTER — Ambulatory Visit (HOSPITAL_BASED_OUTPATIENT_CLINIC_OR_DEPARTMENT_OTHER)
Admission: RE | Admit: 2023-09-23 | Discharge: 2023-09-23 | Disposition: A | Discharge status: Home / Self Care | Payer: Self-pay | Source: Ambulatory Visit | Attending: Family Medicine | Admitting: Family Medicine

## 2023-09-23 DIAGNOSIS — R748 Abnormal levels of other serum enzymes: Secondary | ICD-10-CM | POA: Insufficient documentation

## 2023-09-23 DIAGNOSIS — E782 Mixed hyperlipidemia: Secondary | ICD-10-CM | POA: Insufficient documentation

## 2023-09-23 NOTE — Telephone Encounter (Signed)
 Called pt to see if he had taken meds to pharmacy, he said not yet, but plans on dealing with that today.

## 2023-09-28 ENCOUNTER — Other Ambulatory Visit: Payer: Self-pay

## 2023-09-28 MED ORDER — METHYLPHENIDATE HCL ER (LA) 10 MG PO CP24: 10.0000 mg | ORAL_CAPSULE | Freq: Every day | ORAL | 0 refills | Status: DC

## 2023-11-12 ENCOUNTER — Telehealth: Payer: No Typology Code available for payment source | Admitting: Physician Assistant

## 2023-11-12 ENCOUNTER — Encounter: Payer: Self-pay | Admitting: Physician Assistant

## 2023-11-12 DIAGNOSIS — F429 Obsessive-compulsive disorder, unspecified: Secondary | ICD-10-CM

## 2023-11-12 DIAGNOSIS — F902 Attention-deficit hyperactivity disorder, combined type: Secondary | ICD-10-CM | POA: Diagnosis not present

## 2023-11-12 DIAGNOSIS — F4010 Social phobia, unspecified: Secondary | ICD-10-CM | POA: Diagnosis not present

## 2023-11-12 MED ORDER — METHYLPHENIDATE HCL ER (LA) 10 MG PO CP24: 10.0000 mg | ORAL_CAPSULE | Freq: Every day | ORAL | 0 refills | Status: DC

## 2023-11-12 MED ORDER — METHYLPHENIDATE HCL 10 MG PO TABS: 5.0000 mg | ORAL_TABLET | Freq: Every day | ORAL | 0 refills | Status: DC

## 2023-11-12 NOTE — Progress Notes (Signed)
 [facility]  Patient ID: Robert Robbins,  MRN: [id number]  PCP: Gweneth Dimitri, MD  Date of Evaluation: 11/12/2023 time spent:20 minutes  Chief Complaint:  Chief Complaint   Anxiety; Depression; ADD; Follow-up    Virtual Visit via Telehealth  I connected with patient by a video enabled telemedicine application with their informed consent, and verified patient privacy and that I am speaking with the correct person using two identifiers.  I am private, in my office and the patient is at work.  I discussed the limitations, risks, security and privacy concerns of performing an evaluation and management service by video and the availability of in person appointments. I also discussed with the patient that there may be a patient responsible charge related to this service. The patient expressed understanding and agreed to proceed.   I discussed the assessment and treatment plan with the patient. The patient was provided an opportunity to ask questions and all were answered. The patient agreed with the plan and demonstrated an understanding of the instructions.   The patient was advised to call back or seek an in-person evaluation if the symptoms worsen or if the condition fails to improve as anticipated.  I provided 25 minutes of non-face-to-face time during this encounter.  HISTORY/CURRENT STATUS: HPI   for 74-month med  For the most part Robert Robbins is doing well as far as focus and attention go.  We increased the Ritalin LA up to 30 mg and that was way too strong.  Even at 20 mg he had more anxiety and jitteriness.  The 10 mg does not cause those side effects although it does not always last long enough.  But he likes the effects that it does give him.  He is more able to stay on task at work and at home.  Patient is able to enjoy things.  Energy and motivation are good.  Work is going well.   No extreme sadness, tearfulness, or feelings of hopelessness.  Sleeps well most of the time.  ADLs and personal hygiene are normal.  Appetite has not changed.  Weight is stable.  Still gets anxious especially in social situations. Xanax helps.  He doesn't take too often.  Denies suicidal or homicidal thoughts.  Patient denies increased energy with decreased need for sleep, increased talkativeness, racing thoughts, impulsivity or risky behaviors, increased spending, increased libido, grandiosity, increased irritability or anger, paranoia, or hallucinations.  Denies dizziness, syncope, seizures, numbness, tingling, tremor, tics, unsteady gait, slurred speech, confusion. Denies muscle or joint pain, stiffness, or dystonia.  Individual Medical History/ Review of Systems: Changes? :No      Past medications for mental health diagnoses include: Propranolol, Lexapro, Prozac, Xanax, Viibryd, hydroxyzine  Allergies: Patient has no known allergies.  Current Medications:  Current Outpatient Medications:    ALPRAZolam (XANAX) 0.5 MG tablet, Take 1 tablet (0.5 mg total) by mouth 2 (two) times daily as needed for anxiety., Disp: 30 tablet, Rfl: 1   busPIRone (BUSPAR) 15 MG tablet, Take 1 tablet (15 mg total) by mouth 2 (two) times daily., Disp: 180 tablet, Rfl: 1   FLUoxetine (PROZAC) 40 MG capsule, Take 2 capsules (80 mg total) by mouth daily., Disp: 180 capsule, Rfl: 1   levocetirizine (XYZAL) 5 MG tablet, Take 5 mg by mouth every evening., Disp: , Rfl:    [START ON 01/08/2024] methylphenidate (RITALIN LA) 10 MG 24 hr capsule, Take 1 capsule (10 mg total) by mouth daily., Disp: 30 capsule, Rfl: 0   [START ON 12/09/2023]  methylphenidate (RITALIN LA) 10 MG 24 hr capsule, Take 1 capsule (10 mg total) by mouth daily., Disp: 30 capsule, Rfl: 0   methylphenidate (RITALIN) 10 MG tablet, Take 0.5-1 tablets (5-10 mg total) by mouth daily with lunch., Disp: 30 tablet, Rfl: 0   Ascorbic Acid (VITAMIN C) 100 MG tablet, Take 100 mg by mouth daily. (Patient not taking: Reported on 07/24/2020), Disp: , Rfl:     methylphenidate (RITALIN LA) 10 MG 24 hr capsule, Take 1 capsule (10 mg total) by mouth daily., Disp: 30 capsule, Rfl: 0 Medication Side Effects: none  Family Medical/ Social History: Changes?  none  MENTAL HEALTH EXAM:  There were no vitals taken for this visit.There is no height or weight on file to calculate BMI.  General Appearance: Casual and Well Groomed  Eye Contact:  Good  Speech:  Clear and Coherent and Normal Rate  Volume:  Normal  Mood:  Euthymic  Affect:  Congruent  Thought Process:  Goal Directed and Descriptions of Associations: Intact  Orientation:  Full (Time, Place, and Person)  Thought Content: Logical   Suicidal Thoughts:  No  Homicidal Thoughts:  No  Memory:  WNL  Judgement:  Good  Insight:  Good  Psychomotor Activity:  Normal  Concentration:  Concentration: Good and Attention Span: Good  Recall:  Good  Fund of Knowledge: Good  Language: Good  Assets:  Desire for Improvement Financial Resources/Insurance Housing Resilience Transportation Vocational/Educational  ADL's:  Intact  Cognition: WNL  Prognosis:  Good   DIAGNOSES:    ICD-10-CM   1. Attention deficit hyperactivity disorder (ADHD), combined type  F90.2     2. Social anxiety disorder  F40.10     3. Obsessive-compulsive disorder, unspecified type  F42.9      Receiving Psychotherapy: Yes   [facility] Champagne in [place]  RECOMMENDATIONS:  PDMP reviewed.  Xanax filled Ativan filled 10/05/2023.  Ritalin LA filled 09/28/2023. I provided 20 minutes of non-face to face time during this encounter, including time spent before and after the visit in records review, medical decision making, counseling pertinent to today's visit, and charting.   Recommend adding a short acting Ritalin for prn use in the afternoon. Benefits, risks, and SE discussed and he would like to try it.   Continue Xanax 0.5 mg, 1 po bid prn.  Take sparingly. Continue BuSpar 15 mg, 1 po bid.  Continue Prozac 80 mg. Continue Ritalin LA 10  mg, 1 p.o. every morning. Start Ritalin 10 mg, 1/2-1 po at lunch prn. Continue counseling with Eliezer Champagne Return in 6-8 weeks.  Melony Overly, PA-C

## 2024-01-07 ENCOUNTER — Other Ambulatory Visit: Payer: Self-pay | Admitting: Physician Assistant

## 2024-01-09 NOTE — Telephone Encounter (Signed)
 Please get scheduled for his follow up

## 2024-01-12 NOTE — Telephone Encounter (Signed)
 LVM @ 4:40p for pt to call and schedule follow up appt.

## 2024-05-10 ENCOUNTER — Telehealth: Payer: Self-pay | Admitting: Physician Assistant

## 2024-05-10 NOTE — Telephone Encounter (Signed)
 Pt called requesting Rx for Prozac  40 mg 2/d and generic Ritalin  LA 10 mg 1/d. Pt stated he had meds left over from taking previously. Has continuously taken meds. Follow up over due 12/24/23. Apt now 9/29 and on canc list.  New pharmacy: [facility] [phone] [employer]. Pt contact # [phone]

## 2024-05-10 NOTE — Telephone Encounter (Signed)
 Pt reports that he has taken medications continually, but LF Ritalin  4/28 and last filled fluoxetine  January 2025 for a 90-day supply. RF was sent in April that has not been picked up.

## 2024-05-11 NOTE — Telephone Encounter (Signed)
 LVM to [facility]

## 2024-05-16 ENCOUNTER — Other Ambulatory Visit: Payer: Self-pay

## 2024-05-16 DIAGNOSIS — F902 Attention-deficit hyperactivity disorder, combined type: Secondary | ICD-10-CM

## 2024-05-16 MED ORDER — FLUOXETINE HCL 40 MG PO CAPS: 80.0000 mg | ORAL_CAPSULE | Freq: Every day | ORAL | 0 refills | Status: DC

## 2024-05-16 NOTE — Addendum Note (Signed)
 Addended by: CON BRISTLE T on: 05/16/2024 08:40 AM   Modules accepted: Orders

## 2024-05-16 NOTE — Telephone Encounter (Signed)
 Sent Prozac , pended Ritalin 

## 2024-05-18 ENCOUNTER — Telehealth: Payer: Self-pay | Admitting: Physician Assistant

## 2024-05-18 NOTE — Telephone Encounter (Signed)
 LVM to [facility]

## 2024-05-18 NOTE — Telephone Encounter (Signed)
 Pt notified that methylphenidate  was denied due to not following up as requested.

## 2024-05-18 NOTE — Telephone Encounter (Signed)
 Pt Lvm @ 10:17a requesting refill of methylphenidate  10mg  to   [facility] [address] [place], [place] - [address], [place] [address] Phone: [phone]  Fax: [phone]    Next appt 9/29

## 2024-05-18 NOTE — Telephone Encounter (Signed)
 Previous request for RF refused because patient is past due for FU.

## 2024-06-05 ENCOUNTER — Telehealth (INDEPENDENT_AMBULATORY_CARE_PROVIDER_SITE_OTHER): Admitting: Physician Assistant

## 2024-06-05 ENCOUNTER — Telehealth: Payer: Self-pay | Admitting: Physician Assistant

## 2024-06-05 ENCOUNTER — Encounter: Payer: Self-pay | Admitting: Physician Assistant

## 2024-06-05 DIAGNOSIS — F902 Attention-deficit hyperactivity disorder, combined type: Secondary | ICD-10-CM | POA: Diagnosis not present

## 2024-06-05 DIAGNOSIS — F3342 Major depressive disorder, recurrent, in full remission: Secondary | ICD-10-CM | POA: Diagnosis not present

## 2024-06-05 DIAGNOSIS — F4010 Social phobia, unspecified: Secondary | ICD-10-CM | POA: Diagnosis not present

## 2024-06-05 MED ORDER — METHYLPHENIDATE HCL 10 MG PO TABS: 5.0000 mg | ORAL_TABLET | Freq: Every day | ORAL | 0 refills | Status: AC

## 2024-06-05 MED ORDER — METHYLPHENIDATE HCL ER (LA) 10 MG PO CP24: 10.0000 mg | ORAL_CAPSULE | Freq: Every day | ORAL | 0 refills | Status: AC

## 2024-06-05 MED ORDER — FLUOXETINE HCL 40 MG PO CAPS: 80.0000 mg | ORAL_CAPSULE | Freq: Every day | ORAL | 1 refills | Status: AC

## 2024-06-05 MED ORDER — ALPRAZOLAM 0.5 MG PO TABS: 0.5000 mg | ORAL_TABLET | Freq: Two times a day (BID) | ORAL | 1 refills | Status: AC | PRN

## 2024-06-05 NOTE — Telephone Encounter (Signed)
 Norman at [facility] LVM @ 10:54a.  He said this is the first time they have filled Methylphenidate  and Alprazolam  scripts for the pt.  For DEA auditing purposes, they need to know the pt's diagnosis and the last time the pt was seen in the office.  No upcoming appt schedule  (pt was seen today)

## 2024-06-05 NOTE — Progress Notes (Signed)
 [facility]  Patient ID: Robert Robbins,  MRN: [id number]  PCP: Aisha Harvey, MD  Date of Evaluation: 06/05/2024 Time spent:20 minutes  Chief Complaint:  Chief Complaint   ADHD; Follow-up; Anxiety; Depression    Virtual Visit via Telehealth  I connected with patient by a video enabled telemedicine application with their informed consent, and verified patient privacy and that I am speaking with the correct person using two identifiers.  I am private, in my office and the patient is at work.  I discussed the limitations, risks, security and privacy concerns of performing an evaluation and management service by video and the availability of in person appointments. I also discussed with the patient that there may be a patient responsible charge related to this service. The patient expressed understanding and agreed to proceed.   I discussed the assessment and treatment plan with the patient. The patient was provided an opportunity to ask questions and all were answered. The patient agreed with the plan and demonstrated an understanding of the instructions.   The patient was advised to call back or seek an in-person evaluation if the symptoms worsen or if the condition fails to improve as anticipated.  I provided 20 minutes of non-face-to-face time during this encounter.  HISTORY/CURRENT STATUS: HPI   For 6 month med check  Doesn't take Ritalin  every day. Takes it on days he works though.  It is effective, both the short and long acting.   Patient is able to enjoy things.  Energy and motivation are good.  Work is going well.   No extreme sadness, tearfulness, or feelings of hopelessness.  Sleeps well most of the time. ADLs and personal hygiene are normal. Appetite has not changed.  Weight is stable.  He is controlled, occurs more often when he has to speak in public.  He does not take the Xanax  very often but it is effective when he does.  No mania, delirium, AH/VH.  No  SI/HI.  Individual Medical History/ Review of Systems: Changes? :No    having jaw surgery next month.   Past medications for mental health diagnoses include: Propranolol, Lexapro, Prozac , Xanax , Viibryd , hydroxyzine   Allergies: Patient has no known allergies.  Current Medications:  Current Outpatient Medications:    levocetirizine (XYZAL) 5 MG tablet, Take 5 mg by mouth every evening., Disp: , Rfl:    ALPRAZolam  (XANAX ) 0.5 MG tablet, Take 1 tablet (0.5 mg total) by mouth 2 (two) times daily as needed for anxiety., Disp: 30 tablet, Rfl: 1   Ascorbic Acid (VITAMIN C) 100 MG tablet, Take 100 mg by mouth daily. (Patient not taking: Reported on 07/24/2020), Disp: , Rfl:    FLUoxetine  (PROZAC ) 40 MG capsule, Take 2 capsules (80 mg total) by mouth daily., Disp: 180 capsule, Rfl: 1   [START ON 08/03/2024] methylphenidate  (RITALIN  LA) 10 MG 24 hr capsule, Take 1 capsule (10 mg total) by mouth daily., Disp: 30 capsule, Rfl: 0   [START ON 07/04/2024] methylphenidate  (RITALIN  LA) 10 MG 24 hr capsule, Take 1 capsule (10 mg total) by mouth daily., Disp: 30 capsule, Rfl: 0   methylphenidate  (RITALIN  LA) 10 MG 24 hr capsule, Take 1 capsule (10 mg total) by mouth daily., Disp: 30 capsule, Rfl: 0   methylphenidate  (RITALIN ) 10 MG tablet, Take 0.5-1 tablets (5-10 mg total) by mouth daily with lunch., Disp: 30 tablet, Rfl: 0 Medication Side Effects: none  Family Medical/ Social History: Changes?  none  MENTAL HEALTH EXAM:  There were no vitals taken  for this visit.There is no height or weight on file to calculate BMI.  General Appearance: Casual and Well Groomed  Eye Contact:  Good  Speech:  Clear and Coherent and Normal Rate  Volume:  Normal  Mood:  Euthymic  Affect:  Congruent  Thought Process:  Goal Directed and Descriptions of Associations: Intact  Orientation:  Full (Time, Place, and Person)  Thought Content: Logical   Suicidal Thoughts:  No  Homicidal Thoughts:  No  Memory:  WNL  Judgement:   Good  Insight:  Good  Psychomotor Activity:  Normal  Concentration:  Concentration: Good and Attention Span: Good  Recall:  Good  Fund of Knowledge: Good  Language: Good  Assets:  Communication Skills Desire for Improvement Financial Resources/Insurance Housing Resilience Transportation Vocational/Educational  ADL's:  Intact  Cognition: WNL  Prognosis:  Good   DIAGNOSES:    ICD-10-CM   1. Attention deficit hyperactivity disorder (ADHD), combined type  F90.2     2. Social anxiety disorder  F40.10     3. Recurrent major depression in full remission [facility])  F33.42      Receiving Psychotherapy: Yes   [facility] in [place]  RECOMMENDATIONS:  PDMP reviewed.  Xanax  filled Ativan filled 06/17/2023.  Ritalin  filled 01/17/2024. I provided approximately 20  minutes of face to face time during this encounter, including time spent before and after the visit in records review, medical decision making, counseling pertinent to today's visit, and charting.   He asked about how to take the Prozac  when he is recovering from the jaw surgery.  It does come as a liquid but is difficult to find and it may be cost prohibitive.  I recommend he open up the capsule and sprinkle the contents in a teaspoon of applesauce or what ever.  We discussed the long half-life of Prozac  so even if he skipped a few weeks it probably will not be detrimental to his mental health.  He may need to only take the short acting Ritalin  during that time as well.  If he needs a new prescription he can let me know.  Continue Xanax  0.5 mg, 1 po bid prn.  Take sparingly. Continue Prozac  80 mg. Continue Ritalin  LA 10 mg, 1 p.o. every morning. Continue Ritalin  10 mg, 1/2-1 po at lunch prn. Continue counseling with [facility] Return in 6 months.  Verneita Cooks, PA-C

## 2024-06-05 NOTE — Telephone Encounter (Signed)
 Called Robert Robbins and provided the requested information.

## 2024-06-19 ENCOUNTER — Telehealth: Admitting: Physician Assistant
